# Patient Record
Sex: Female | Born: 1970 | Race: White | Hispanic: No | Marital: Married | Smoking: Never smoker
Health system: Southern US, Community
[De-identification: ages and names within clinical notes are randomized; demographics above are authoritative.]

## PROBLEM LIST (undated history)

## (undated) DIAGNOSIS — O24419 Gestational diabetes mellitus in pregnancy, unspecified control: Secondary | ICD-10-CM

## (undated) DIAGNOSIS — I10 Essential (primary) hypertension: Secondary | ICD-10-CM

## (undated) DIAGNOSIS — O1490 Unspecified pre-eclampsia, unspecified trimester: Secondary | ICD-10-CM

## (undated) DIAGNOSIS — E669 Obesity, unspecified: Secondary | ICD-10-CM

## (undated) DIAGNOSIS — F419 Anxiety disorder, unspecified: Secondary | ICD-10-CM

## (undated) HISTORY — DX: Anxiety disorder, unspecified: F41.9

## (undated) HISTORY — DX: Essential (primary) hypertension: I10

## (undated) HISTORY — DX: Gestational diabetes mellitus in pregnancy, unspecified control: O24.419

## (undated) HISTORY — PX: NASAL SEPTUM SURGERY: SHX37

## (undated) HISTORY — DX: Obesity, unspecified: E66.9

## (undated) HISTORY — DX: Unspecified pre-eclampsia, unspecified trimester: O14.90

---

## 2003-02-20 HISTORY — PX: SPINAL FUSION: SHX223

## 2003-09-02 ENCOUNTER — Encounter: Admission: RE | Admit: 2003-09-02 | Discharge: 2003-09-02 | Payer: Self-pay | Admitting: Neurosurgery

## 2003-09-23 ENCOUNTER — Inpatient Hospital Stay (HOSPITAL_COMMUNITY): Admission: RE | Admit: 2003-09-23 | Discharge: 2003-09-25 | Payer: Self-pay | Admitting: Neurosurgery

## 2003-11-09 ENCOUNTER — Encounter: Admission: RE | Admit: 2003-11-09 | Discharge: 2003-11-09 | Payer: Self-pay | Admitting: Neurosurgery

## 2003-12-21 ENCOUNTER — Encounter: Admission: RE | Admit: 2003-12-21 | Discharge: 2003-12-21 | Payer: Self-pay | Admitting: Neurosurgery

## 2006-02-19 HISTORY — PX: ENDOMETRIAL ABLATION: SHX621

## 2010-04-04 ENCOUNTER — Ambulatory Visit (INDEPENDENT_AMBULATORY_CARE_PROVIDER_SITE_OTHER): Payer: BC Managed Care – PPO | Admitting: Cardiovascular Disease

## 2010-04-04 DIAGNOSIS — R079 Chest pain, unspecified: Secondary | ICD-10-CM

## 2010-04-04 DIAGNOSIS — I10 Essential (primary) hypertension: Secondary | ICD-10-CM

## 2010-04-04 DIAGNOSIS — R011 Cardiac murmur, unspecified: Secondary | ICD-10-CM

## 2010-04-10 ENCOUNTER — Telehealth (INDEPENDENT_AMBULATORY_CARE_PROVIDER_SITE_OTHER): Payer: Self-pay | Admitting: *Deleted

## 2010-04-17 ENCOUNTER — Telehealth: Payer: Self-pay | Admitting: Cardiovascular Disease

## 2010-04-18 NOTE — Progress Notes (Signed)
----   Converted from flag ---- ---- 04/07/2010 4:36 PM, Marilynne Halsted, CMA, AAMA wrote: Victorino Dike,  Pt has BCBS of [place] only.  Per [employer], pt's plan does not require authorization.  Thanks,  Charmaine ------------------------------

## 2010-04-20 ENCOUNTER — Telehealth: Payer: Self-pay | Admitting: Cardiovascular Disease

## 2010-04-27 NOTE — Progress Notes (Signed)
Summary: stress test results  Phone Note Outgoing Call   Call placed by: Dessie Coma  LPN,  April 20, 2010 2:20 PM Call placed to: Patient Summary of Call: LMVM-informed patient stress test was normal per Dr. Kirke Corin.

## 2010-04-27 NOTE — Progress Notes (Signed)
Summary: pt would like test results/**LM/nm  Phone Note Call from Patient Call back at Home Phone [phone] Call back at [phone]   Caller: Patient Reason for Call: Talk to Nurse, Talk to Doctor, Lab or Test Results Summary of Call: pt would like results of stress test she took last week Initial call taken by: Omer Jack,  April 17, 2010 1:36 PM  Follow-up for Phone Call        [facility]. Ollen Gross, RN, BSN  April 17, 2010 1:53 PM  Patient had stress test done last week @ [facility]. The results are not in EMR. Advised patient to call the [place] office Thursday and hopefully her results would be in. Whitney Maeola Sarah RN  April 17, 2010 3:58 PM  Follow-up by: Whitney Maeola Sarah RN,  April 17, 2010 3:58 PM  Additional Follow-up for Phone Call Additional follow up Details #1::        spoke to medical records at [facility].  They are to fax results to [phone].   LMVM notifiying patient of normal stress test.  To call back if any questions. Additional Follow-up by: Dessie Coma  LPN,  April 18, 2010 10:23 AM

## 2010-05-12 NOTE — Letter (Signed)
April 04, 2010   Dr. Gwendlyn Deutscher [address] [place], [place]  [address]  RE:  Taylor, [name] MRN:  [id number]  /  DOB:  1970-05-12  Dear Dr. Jeanie Sewer,  Thank you for referring Taylor [name] for further cardiac evaluation.  As you are aware, this is a pleasant 40 year old female with Taylor following problem list: 1. Hypertension with previous history of preeclampsia. 2. History of gestational diabetes. 3. Family history of premature coronary artery disease.  HISTORY OF PRESENT ILLNESS:  Taylor [name] is here today for evaluation of chest pain, which has been going on for a few weeks.  Taylor [name] has been under significant stress lately after Taylor sudden death of her father at Taylor age of 75.  An autopsy showed that he died from a myocardial infarction and undiagnosed coronary artery disease.  Since then, Taylor [name] has been very worried.  She started having episodes of chest discomfort.  According to her, she was having those before, but did not pay much attention to it.  She describes a substernal aching sensation, which has been induced recently by stress.  She did have a few episodes while she was doing her exercises in Taylor past.  She is very active and before her father's death, she used to exercise 3-4 times a week by running about 3 miles.  She also noted that her blood pressure has been running high recently.  She was started yesterday on atenolol. She has felt better since then.  She denies any previous cardiac history.  She does not have any dyspnea.  There is no syncope or presyncope.  She does complain of palpitations, which actually have improved after starting atenolol.  MEDICATIONS: 1. Atenolol 50 mg once daily. 2. Aspirin 81 mg once daily. 3. Xanax 0.5 mg at bedtime as needed.  ALLERGIES:  NO KNOWN DRUG ALLERGIES.  SOCIAL HISTORY:  Negative for smoking, alcohol or recreational drug use. She drinks 2 cups of coffee a day.  She was exercising regularly  until Taylor death of her father.  She works part-time in an office.  PAST SURGICAL HISTORY: 1. C-section. 2. Spinal fusion.  FAMILY HISTORY:  Very significant for premature coronary artery disease. Her father died suddenly at Taylor age of 43 from a myocardial infarction. Her maternal grandfather also had heart disease at Taylor age of 53.  Her grandfather had heart disease at Taylor age of 39.  REVIEW OF SYSTEMS:  Remarkable for chest pain as outlined above.  There has been also significant anxiety.  A full review of system was performed and is otherwise negative.  PHYSICAL EXAMINATION:  GENERAL:  Taylor [name] appears to be at her stated age and in no acute distress. VITAL SIGNS:  Weight is 155.2 pounds.  Blood pressure is 139/95, pulse is 74, oxygen saturation is 97% on room air. HEENT:  Normocephalic, atraumatic. NECK:  No JVD or carotid bruits. RESPIRATORY:  Normal respiratory effort with no use of accessory muscles.  Auscultation reveals normal breath sounds. CARDIOVASCULAR:  Normal PMI.  Normal S1-S2 with no gallops.  There is a 1/6 systolic ejection murmur at Taylor aortic area, which seems to be a benign flow murmur. ABDOMEN:  Benign, nontender, nondistended. EXTREMITIES:  With no clubbing, cyanosis or edema. SKIN:  Warm and dry with no rash. PSYCHIATRIC:  She is alert and oriented x3 with normal mood and affect. MUSCULOSKELETAL:  There is normal muscle strength in Taylor upper and lower extremities.  An electrocardiogram was performed, which showed normal  sinus rhythm with incomplete right bundle branch block.  IMPRESSION: 1. Atypical chest pain:  Her symptoms seem to be induced by anxiety.     Her risk factors for coronary artery disease include hypertension     and family history of premature coronary artery disease.  Her     electrocardiogram does not show any acute changes.  Incomplete     right bundle branch block can be a normal variant, especially in     young people.  I  believe her recurrent symptoms warrants further     investigation.  Thus, I recommend proceeding with a stress     echocardiogram for further evaluation.  I discussed with her that     even if Taylor stress test is normal, continuing healthy lifestyle     modifications is strongly recommended due to her strong family     history of premature coronary artery disease.  If Taylor stress test     is normal, then she does not need to continue taking aspirin daily.     I do recommend that she get her fasting lipid profile checked and     treated if needed based on Taylor results. 2. Hypertension:  Seems to be essential hypertension due to her family     history of this.  This has improved after starting atenolol. 3. Benign murmur, seems to be a flow murmur.  She will be having a     stress echocardiogram, which will give some idea about that as     well.  Taylor [name] will be notified with Taylor results of Taylor stress     test.  She will follow up if her stress test is abnormal.  Thank you for allowing me to participate in Taylor care of your [name].   Sincerely,     Lorine Bears, MD Electronically Signed   MA/MedQ  DD: 04/04/2010  DT: 04/04/2010  Job #: [id number]

## 2010-10-26 ENCOUNTER — Encounter: Payer: Self-pay | Admitting: Cardiovascular Disease

## 2010-11-10 ENCOUNTER — Encounter: Payer: Self-pay | Admitting: Cardiovascular Disease

## 2015-09-14 DIAGNOSIS — S0501XA Injury of conjunctiva and corneal abrasion without foreign body, right eye, initial encounter: Secondary | ICD-10-CM | POA: Diagnosis not present

## 2015-10-14 DIAGNOSIS — Z1151 Encounter for screening for human papillomavirus (HPV): Secondary | ICD-10-CM | POA: Diagnosis not present

## 2015-10-14 DIAGNOSIS — R350 Frequency of micturition: Secondary | ICD-10-CM | POA: Diagnosis not present

## 2015-10-14 DIAGNOSIS — Z01419 Encounter for gynecological examination (general) (routine) without abnormal findings: Secondary | ICD-10-CM | POA: Diagnosis not present

## 2015-10-14 DIAGNOSIS — Z1239 Encounter for other screening for malignant neoplasm of breast: Secondary | ICD-10-CM | POA: Diagnosis not present

## 2015-10-28 DIAGNOSIS — Z1231 Encounter for screening mammogram for malignant neoplasm of breast: Secondary | ICD-10-CM | POA: Diagnosis not present

## 2015-11-03 DIAGNOSIS — N6489 Other specified disorders of breast: Secondary | ICD-10-CM | POA: Diagnosis not present

## 2015-11-03 DIAGNOSIS — R928 Other abnormal and inconclusive findings on diagnostic imaging of breast: Secondary | ICD-10-CM | POA: Diagnosis not present

## 2016-05-03 DIAGNOSIS — R928 Other abnormal and inconclusive findings on diagnostic imaging of breast: Secondary | ICD-10-CM | POA: Diagnosis not present

## 2016-06-21 DIAGNOSIS — E7800 Pure hypercholesterolemia, unspecified: Secondary | ICD-10-CM | POA: Diagnosis not present

## 2016-06-21 DIAGNOSIS — E042 Nontoxic multinodular goiter: Secondary | ICD-10-CM | POA: Diagnosis not present

## 2016-06-21 DIAGNOSIS — Z6828 Body mass index (BMI) 28.0-28.9, adult: Secondary | ICD-10-CM | POA: Diagnosis not present

## 2016-06-28 DIAGNOSIS — E042 Nontoxic multinodular goiter: Secondary | ICD-10-CM | POA: Diagnosis not present

## 2016-06-28 DIAGNOSIS — E041 Nontoxic single thyroid nodule: Secondary | ICD-10-CM | POA: Diagnosis not present

## 2016-10-16 DIAGNOSIS — Z01419 Encounter for gynecological examination (general) (routine) without abnormal findings: Secondary | ICD-10-CM | POA: Diagnosis not present

## 2016-11-05 DIAGNOSIS — R928 Other abnormal and inconclusive findings on diagnostic imaging of breast: Secondary | ICD-10-CM | POA: Diagnosis not present

## 2016-11-09 DIAGNOSIS — M545 Low back pain: Secondary | ICD-10-CM | POA: Diagnosis not present

## 2016-11-09 DIAGNOSIS — M9905 Segmental and somatic dysfunction of pelvic region: Secondary | ICD-10-CM | POA: Diagnosis not present

## 2016-11-09 DIAGNOSIS — M9903 Segmental and somatic dysfunction of lumbar region: Secondary | ICD-10-CM | POA: Diagnosis not present

## 2016-11-09 DIAGNOSIS — M9902 Segmental and somatic dysfunction of thoracic region: Secondary | ICD-10-CM | POA: Diagnosis not present

## 2016-11-12 DIAGNOSIS — M9905 Segmental and somatic dysfunction of pelvic region: Secondary | ICD-10-CM | POA: Diagnosis not present

## 2016-11-12 DIAGNOSIS — M9902 Segmental and somatic dysfunction of thoracic region: Secondary | ICD-10-CM | POA: Diagnosis not present

## 2016-11-12 DIAGNOSIS — M545 Low back pain: Secondary | ICD-10-CM | POA: Diagnosis not present

## 2016-11-12 DIAGNOSIS — M9903 Segmental and somatic dysfunction of lumbar region: Secondary | ICD-10-CM | POA: Diagnosis not present

## 2016-11-13 DIAGNOSIS — J329 Chronic sinusitis, unspecified: Secondary | ICD-10-CM | POA: Diagnosis not present

## 2016-11-13 DIAGNOSIS — J302 Other seasonal allergic rhinitis: Secondary | ICD-10-CM | POA: Diagnosis not present

## 2016-11-13 DIAGNOSIS — E876 Hypokalemia: Secondary | ICD-10-CM | POA: Diagnosis not present

## 2016-11-13 DIAGNOSIS — Z6828 Body mass index (BMI) 28.0-28.9, adult: Secondary | ICD-10-CM | POA: Diagnosis not present

## 2016-11-26 DIAGNOSIS — Z6829 Body mass index (BMI) 29.0-29.9, adult: Secondary | ICD-10-CM | POA: Diagnosis not present

## 2016-11-26 DIAGNOSIS — I10 Essential (primary) hypertension: Secondary | ICD-10-CM | POA: Diagnosis not present

## 2016-11-26 DIAGNOSIS — J329 Chronic sinusitis, unspecified: Secondary | ICD-10-CM | POA: Diagnosis not present

## 2016-11-26 DIAGNOSIS — J302 Other seasonal allergic rhinitis: Secondary | ICD-10-CM | POA: Diagnosis not present

## 2016-11-26 DIAGNOSIS — E876 Hypokalemia: Secondary | ICD-10-CM | POA: Diagnosis not present

## 2016-12-10 DIAGNOSIS — J342 Deviated nasal septum: Secondary | ICD-10-CM | POA: Diagnosis not present

## 2016-12-10 DIAGNOSIS — J3489 Other specified disorders of nose and nasal sinuses: Secondary | ICD-10-CM | POA: Diagnosis not present

## 2016-12-10 DIAGNOSIS — Z8709 Personal history of other diseases of the respiratory system: Secondary | ICD-10-CM | POA: Diagnosis not present

## 2016-12-10 DIAGNOSIS — R0981 Nasal congestion: Secondary | ICD-10-CM | POA: Diagnosis not present

## 2016-12-19 DIAGNOSIS — J329 Chronic sinusitis, unspecified: Secondary | ICD-10-CM | POA: Diagnosis not present

## 2016-12-24 DIAGNOSIS — J342 Deviated nasal septum: Secondary | ICD-10-CM | POA: Diagnosis not present

## 2016-12-24 DIAGNOSIS — J343 Hypertrophy of nasal turbinates: Secondary | ICD-10-CM | POA: Diagnosis not present

## 2016-12-24 DIAGNOSIS — J3489 Other specified disorders of nose and nasal sinuses: Secondary | ICD-10-CM | POA: Diagnosis not present

## 2016-12-24 DIAGNOSIS — R0981 Nasal congestion: Secondary | ICD-10-CM | POA: Diagnosis not present

## 2016-12-28 DIAGNOSIS — J329 Chronic sinusitis, unspecified: Secondary | ICD-10-CM | POA: Diagnosis not present

## 2016-12-28 DIAGNOSIS — Z6828 Body mass index (BMI) 28.0-28.9, adult: Secondary | ICD-10-CM | POA: Diagnosis not present

## 2016-12-28 DIAGNOSIS — J342 Deviated nasal septum: Secondary | ICD-10-CM | POA: Diagnosis not present

## 2016-12-28 DIAGNOSIS — J343 Hypertrophy of nasal turbinates: Secondary | ICD-10-CM | POA: Diagnosis not present

## 2017-02-04 DIAGNOSIS — J343 Hypertrophy of nasal turbinates: Secondary | ICD-10-CM | POA: Diagnosis not present

## 2017-02-04 DIAGNOSIS — J3489 Other specified disorders of nose and nasal sinuses: Secondary | ICD-10-CM | POA: Diagnosis not present

## 2017-02-04 DIAGNOSIS — J342 Deviated nasal septum: Secondary | ICD-10-CM | POA: Diagnosis not present

## 2017-02-04 DIAGNOSIS — R0981 Nasal congestion: Secondary | ICD-10-CM | POA: Diagnosis not present

## 2017-02-13 DIAGNOSIS — R0981 Nasal congestion: Secondary | ICD-10-CM | POA: Diagnosis not present

## 2017-02-13 DIAGNOSIS — J3489 Other specified disorders of nose and nasal sinuses: Secondary | ICD-10-CM | POA: Diagnosis not present

## 2017-02-13 DIAGNOSIS — J324 Chronic pansinusitis: Secondary | ICD-10-CM | POA: Diagnosis not present

## 2017-02-13 DIAGNOSIS — J0111 Acute recurrent frontal sinusitis: Secondary | ICD-10-CM | POA: Diagnosis not present

## 2017-02-13 DIAGNOSIS — J321 Chronic frontal sinusitis: Secondary | ICD-10-CM | POA: Diagnosis not present

## 2017-02-13 DIAGNOSIS — J342 Deviated nasal septum: Secondary | ICD-10-CM | POA: Diagnosis not present

## 2017-02-13 DIAGNOSIS — J0101 Acute recurrent maxillary sinusitis: Secondary | ICD-10-CM | POA: Diagnosis not present

## 2017-02-13 DIAGNOSIS — J320 Chronic maxillary sinusitis: Secondary | ICD-10-CM | POA: Diagnosis not present

## 2017-02-13 DIAGNOSIS — I10 Essential (primary) hypertension: Secondary | ICD-10-CM | POA: Diagnosis not present

## 2017-02-13 DIAGNOSIS — Z79899 Other long term (current) drug therapy: Secondary | ICD-10-CM | POA: Diagnosis not present

## 2017-02-13 DIAGNOSIS — J0121 Acute recurrent ethmoidal sinusitis: Secondary | ICD-10-CM | POA: Diagnosis not present

## 2017-02-13 DIAGNOSIS — J322 Chronic ethmoidal sinusitis: Secondary | ICD-10-CM | POA: Diagnosis not present

## 2017-02-13 DIAGNOSIS — J329 Chronic sinusitis, unspecified: Secondary | ICD-10-CM | POA: Diagnosis not present

## 2017-02-13 DIAGNOSIS — J343 Hypertrophy of nasal turbinates: Secondary | ICD-10-CM | POA: Diagnosis not present

## 2017-02-21 DIAGNOSIS — J329 Chronic sinusitis, unspecified: Secondary | ICD-10-CM | POA: Diagnosis not present

## 2017-02-28 DIAGNOSIS — J329 Chronic sinusitis, unspecified: Secondary | ICD-10-CM | POA: Diagnosis not present

## 2017-03-11 DIAGNOSIS — J329 Chronic sinusitis, unspecified: Secondary | ICD-10-CM | POA: Diagnosis not present

## 2017-04-09 DIAGNOSIS — F419 Anxiety disorder, unspecified: Secondary | ICD-10-CM | POA: Diagnosis not present

## 2017-06-24 DIAGNOSIS — J3489 Other specified disorders of nose and nasal sinuses: Secondary | ICD-10-CM | POA: Diagnosis not present

## 2017-06-24 DIAGNOSIS — Z9889 Other specified postprocedural states: Secondary | ICD-10-CM | POA: Diagnosis not present

## 2017-06-24 DIAGNOSIS — Z8709 Personal history of other diseases of the respiratory system: Secondary | ICD-10-CM | POA: Diagnosis not present

## 2017-10-01 DIAGNOSIS — Z6830 Body mass index (BMI) 30.0-30.9, adult: Secondary | ICD-10-CM | POA: Diagnosis not present

## 2017-10-01 DIAGNOSIS — E785 Hyperlipidemia, unspecified: Secondary | ICD-10-CM | POA: Diagnosis not present

## 2017-10-01 DIAGNOSIS — I10 Essential (primary) hypertension: Secondary | ICD-10-CM | POA: Diagnosis not present

## 2017-10-01 DIAGNOSIS — Z1339 Encounter for screening examination for other mental health and behavioral disorders: Secondary | ICD-10-CM | POA: Diagnosis not present

## 2017-10-01 DIAGNOSIS — R5383 Other fatigue: Secondary | ICD-10-CM | POA: Diagnosis not present

## 2017-11-06 DIAGNOSIS — Z1239 Encounter for other screening for malignant neoplasm of breast: Secondary | ICD-10-CM | POA: Diagnosis not present

## 2017-11-06 DIAGNOSIS — Z9189 Other specified personal risk factors, not elsewhere classified: Secondary | ICD-10-CM | POA: Diagnosis not present

## 2017-11-06 DIAGNOSIS — Z01419 Encounter for gynecological examination (general) (routine) without abnormal findings: Secondary | ICD-10-CM | POA: Diagnosis not present

## 2017-11-06 DIAGNOSIS — Z23 Encounter for immunization: Secondary | ICD-10-CM | POA: Diagnosis not present

## 2017-11-07 DIAGNOSIS — M9905 Segmental and somatic dysfunction of pelvic region: Secondary | ICD-10-CM | POA: Diagnosis not present

## 2017-11-07 DIAGNOSIS — M545 Low back pain: Secondary | ICD-10-CM | POA: Diagnosis not present

## 2017-11-07 DIAGNOSIS — M9903 Segmental and somatic dysfunction of lumbar region: Secondary | ICD-10-CM | POA: Diagnosis not present

## 2017-11-07 DIAGNOSIS — M9902 Segmental and somatic dysfunction of thoracic region: Secondary | ICD-10-CM | POA: Diagnosis not present

## 2017-11-12 DIAGNOSIS — M9902 Segmental and somatic dysfunction of thoracic region: Secondary | ICD-10-CM | POA: Diagnosis not present

## 2017-11-12 DIAGNOSIS — M545 Low back pain: Secondary | ICD-10-CM | POA: Diagnosis not present

## 2017-11-12 DIAGNOSIS — M9903 Segmental and somatic dysfunction of lumbar region: Secondary | ICD-10-CM | POA: Diagnosis not present

## 2017-11-12 DIAGNOSIS — M9905 Segmental and somatic dysfunction of pelvic region: Secondary | ICD-10-CM | POA: Diagnosis not present

## 2017-11-14 DIAGNOSIS — M545 Low back pain: Secondary | ICD-10-CM | POA: Diagnosis not present

## 2017-11-14 DIAGNOSIS — M9902 Segmental and somatic dysfunction of thoracic region: Secondary | ICD-10-CM | POA: Diagnosis not present

## 2017-11-14 DIAGNOSIS — M9905 Segmental and somatic dysfunction of pelvic region: Secondary | ICD-10-CM | POA: Diagnosis not present

## 2017-11-14 DIAGNOSIS — M9903 Segmental and somatic dysfunction of lumbar region: Secondary | ICD-10-CM | POA: Diagnosis not present

## 2017-11-20 DIAGNOSIS — G542 Cervical root disorders, not elsewhere classified: Secondary | ICD-10-CM | POA: Diagnosis not present

## 2017-11-20 DIAGNOSIS — Z6831 Body mass index (BMI) 31.0-31.9, adult: Secondary | ICD-10-CM | POA: Diagnosis not present

## 2017-11-22 DIAGNOSIS — G542 Cervical root disorders, not elsewhere classified: Secondary | ICD-10-CM | POA: Diagnosis not present

## 2017-11-22 DIAGNOSIS — M6281 Muscle weakness (generalized): Secondary | ICD-10-CM | POA: Diagnosis not present

## 2017-11-22 DIAGNOSIS — M542 Cervicalgia: Secondary | ICD-10-CM | POA: Diagnosis not present

## 2017-11-22 DIAGNOSIS — M2560 Stiffness of unspecified joint, not elsewhere classified: Secondary | ICD-10-CM | POA: Diagnosis not present

## 2017-11-22 DIAGNOSIS — R293 Abnormal posture: Secondary | ICD-10-CM | POA: Diagnosis not present

## 2017-11-25 DIAGNOSIS — M2560 Stiffness of unspecified joint, not elsewhere classified: Secondary | ICD-10-CM | POA: Diagnosis not present

## 2017-11-25 DIAGNOSIS — R293 Abnormal posture: Secondary | ICD-10-CM | POA: Diagnosis not present

## 2017-11-25 DIAGNOSIS — G542 Cervical root disorders, not elsewhere classified: Secondary | ICD-10-CM | POA: Diagnosis not present

## 2017-11-25 DIAGNOSIS — M542 Cervicalgia: Secondary | ICD-10-CM | POA: Diagnosis not present

## 2017-11-28 DIAGNOSIS — M2560 Stiffness of unspecified joint, not elsewhere classified: Secondary | ICD-10-CM | POA: Diagnosis not present

## 2017-11-28 DIAGNOSIS — G542 Cervical root disorders, not elsewhere classified: Secondary | ICD-10-CM | POA: Diagnosis not present

## 2017-11-28 DIAGNOSIS — M542 Cervicalgia: Secondary | ICD-10-CM | POA: Diagnosis not present

## 2017-11-28 DIAGNOSIS — R293 Abnormal posture: Secondary | ICD-10-CM | POA: Diagnosis not present

## 2017-12-02 DIAGNOSIS — G542 Cervical root disorders, not elsewhere classified: Secondary | ICD-10-CM | POA: Diagnosis not present

## 2017-12-02 DIAGNOSIS — M542 Cervicalgia: Secondary | ICD-10-CM | POA: Diagnosis not present

## 2017-12-02 DIAGNOSIS — R293 Abnormal posture: Secondary | ICD-10-CM | POA: Diagnosis not present

## 2017-12-02 DIAGNOSIS — M6281 Muscle weakness (generalized): Secondary | ICD-10-CM | POA: Diagnosis not present

## 2017-12-02 DIAGNOSIS — M2560 Stiffness of unspecified joint, not elsewhere classified: Secondary | ICD-10-CM | POA: Diagnosis not present

## 2017-12-04 DIAGNOSIS — G542 Cervical root disorders, not elsewhere classified: Secondary | ICD-10-CM | POA: Diagnosis not present

## 2017-12-04 DIAGNOSIS — Z6831 Body mass index (BMI) 31.0-31.9, adult: Secondary | ICD-10-CM | POA: Diagnosis not present

## 2017-12-05 DIAGNOSIS — M6281 Muscle weakness (generalized): Secondary | ICD-10-CM | POA: Diagnosis not present

## 2017-12-05 DIAGNOSIS — G542 Cervical root disorders, not elsewhere classified: Secondary | ICD-10-CM | POA: Diagnosis not present

## 2017-12-05 DIAGNOSIS — M2560 Stiffness of unspecified joint, not elsewhere classified: Secondary | ICD-10-CM | POA: Diagnosis not present

## 2017-12-05 DIAGNOSIS — M542 Cervicalgia: Secondary | ICD-10-CM | POA: Diagnosis not present

## 2017-12-05 DIAGNOSIS — R293 Abnormal posture: Secondary | ICD-10-CM | POA: Diagnosis not present

## 2017-12-06 DIAGNOSIS — M50222 Other cervical disc displacement at C5-C6 level: Secondary | ICD-10-CM | POA: Diagnosis not present

## 2017-12-06 DIAGNOSIS — M542 Cervicalgia: Secondary | ICD-10-CM | POA: Diagnosis not present

## 2017-12-10 DIAGNOSIS — M5412 Radiculopathy, cervical region: Secondary | ICD-10-CM | POA: Diagnosis not present

## 2017-12-10 DIAGNOSIS — I10 Essential (primary) hypertension: Secondary | ICD-10-CM | POA: Diagnosis not present

## 2017-12-10 DIAGNOSIS — M5020 Other cervical disc displacement, unspecified cervical region: Secondary | ICD-10-CM | POA: Diagnosis not present

## 2017-12-10 DIAGNOSIS — M4712 Other spondylosis with myelopathy, cervical region: Secondary | ICD-10-CM | POA: Diagnosis not present

## 2017-12-25 DIAGNOSIS — M4712 Other spondylosis with myelopathy, cervical region: Secondary | ICD-10-CM | POA: Diagnosis not present

## 2017-12-25 DIAGNOSIS — M4722 Other spondylosis with radiculopathy, cervical region: Secondary | ICD-10-CM | POA: Diagnosis not present

## 2017-12-25 DIAGNOSIS — M50122 Cervical disc disorder at C5-C6 level with radiculopathy: Secondary | ICD-10-CM | POA: Diagnosis not present

## 2018-01-14 DIAGNOSIS — M4712 Other spondylosis with myelopathy, cervical region: Secondary | ICD-10-CM | POA: Diagnosis not present

## 2018-01-21 DIAGNOSIS — Z1231 Encounter for screening mammogram for malignant neoplasm of breast: Secondary | ICD-10-CM | POA: Diagnosis not present

## 2018-07-08 DIAGNOSIS — F419 Anxiety disorder, unspecified: Secondary | ICD-10-CM | POA: Diagnosis not present

## 2018-07-08 DIAGNOSIS — Z6828 Body mass index (BMI) 28.0-28.9, adult: Secondary | ICD-10-CM | POA: Diagnosis not present

## 2018-07-08 DIAGNOSIS — J302 Other seasonal allergic rhinitis: Secondary | ICD-10-CM | POA: Diagnosis not present

## 2018-08-08 DIAGNOSIS — M5020 Other cervical disc displacement, unspecified cervical region: Secondary | ICD-10-CM | POA: Diagnosis not present

## 2018-08-08 DIAGNOSIS — Z6829 Body mass index (BMI) 29.0-29.9, adult: Secondary | ICD-10-CM | POA: Diagnosis not present

## 2018-08-08 DIAGNOSIS — I10 Essential (primary) hypertension: Secondary | ICD-10-CM | POA: Diagnosis not present

## 2018-08-08 DIAGNOSIS — M4712 Other spondylosis with myelopathy, cervical region: Secondary | ICD-10-CM | POA: Diagnosis not present

## 2018-11-13 DIAGNOSIS — B9689 Other specified bacterial agents as the cause of diseases classified elsewhere: Secondary | ICD-10-CM | POA: Diagnosis not present

## 2018-11-13 DIAGNOSIS — Z6830 Body mass index (BMI) 30.0-30.9, adult: Secondary | ICD-10-CM | POA: Diagnosis not present

## 2018-11-13 DIAGNOSIS — J329 Chronic sinusitis, unspecified: Secondary | ICD-10-CM | POA: Diagnosis not present

## 2018-11-19 DIAGNOSIS — Z1239 Encounter for other screening for malignant neoplasm of breast: Secondary | ICD-10-CM | POA: Diagnosis not present

## 2018-11-19 DIAGNOSIS — Z01419 Encounter for gynecological examination (general) (routine) without abnormal findings: Secondary | ICD-10-CM | POA: Diagnosis not present

## 2018-11-19 DIAGNOSIS — Z23 Encounter for immunization: Secondary | ICD-10-CM | POA: Diagnosis not present

## 2018-12-18 DIAGNOSIS — Z20828 Contact with and (suspected) exposure to other viral communicable diseases: Secondary | ICD-10-CM | POA: Diagnosis not present

## 2018-12-18 DIAGNOSIS — J329 Chronic sinusitis, unspecified: Secondary | ICD-10-CM | POA: Diagnosis not present

## 2018-12-18 DIAGNOSIS — J40 Bronchitis, not specified as acute or chronic: Secondary | ICD-10-CM | POA: Diagnosis not present

## 2019-01-06 DIAGNOSIS — J310 Chronic rhinitis: Secondary | ICD-10-CM | POA: Diagnosis not present

## 2019-01-06 DIAGNOSIS — Z6830 Body mass index (BMI) 30.0-30.9, adult: Secondary | ICD-10-CM | POA: Diagnosis not present

## 2019-01-06 DIAGNOSIS — T485X5A Adverse effect of other anti-common-cold drugs, initial encounter: Secondary | ICD-10-CM | POA: Diagnosis not present

## 2019-01-06 DIAGNOSIS — J329 Chronic sinusitis, unspecified: Secondary | ICD-10-CM | POA: Diagnosis not present

## 2019-01-23 DIAGNOSIS — Z1231 Encounter for screening mammogram for malignant neoplasm of breast: Secondary | ICD-10-CM | POA: Diagnosis not present

## 2019-01-27 DIAGNOSIS — J338 Other polyp of sinus: Secondary | ICD-10-CM | POA: Diagnosis not present

## 2019-01-27 DIAGNOSIS — J329 Chronic sinusitis, unspecified: Secondary | ICD-10-CM | POA: Diagnosis not present

## 2019-02-02 DIAGNOSIS — Z01818 Encounter for other preprocedural examination: Secondary | ICD-10-CM | POA: Diagnosis not present

## 2019-02-06 DIAGNOSIS — J322 Chronic ethmoidal sinusitis: Secondary | ICD-10-CM | POA: Diagnosis not present

## 2019-02-06 DIAGNOSIS — J324 Chronic pansinusitis: Secondary | ICD-10-CM | POA: Diagnosis not present

## 2019-02-06 DIAGNOSIS — J338 Other polyp of sinus: Secondary | ICD-10-CM | POA: Diagnosis not present

## 2019-02-06 DIAGNOSIS — J321 Chronic frontal sinusitis: Secondary | ICD-10-CM | POA: Diagnosis not present

## 2019-02-06 DIAGNOSIS — J019 Acute sinusitis, unspecified: Secondary | ICD-10-CM | POA: Diagnosis not present

## 2019-02-06 DIAGNOSIS — J330 Polyp of nasal cavity: Secondary | ICD-10-CM | POA: Diagnosis not present

## 2019-02-06 DIAGNOSIS — J320 Chronic maxillary sinusitis: Secondary | ICD-10-CM | POA: Diagnosis not present

## 2019-02-06 DIAGNOSIS — J323 Chronic sphenoidal sinusitis: Secondary | ICD-10-CM | POA: Diagnosis not present

## 2019-02-17 DIAGNOSIS — J3489 Other specified disorders of nose and nasal sinuses: Secondary | ICD-10-CM | POA: Diagnosis not present

## 2019-02-24 DIAGNOSIS — J3489 Other specified disorders of nose and nasal sinuses: Secondary | ICD-10-CM | POA: Diagnosis not present

## 2019-03-14 DIAGNOSIS — Z20828 Contact with and (suspected) exposure to other viral communicable diseases: Secondary | ICD-10-CM | POA: Diagnosis not present

## 2019-03-27 DIAGNOSIS — J3489 Other specified disorders of nose and nasal sinuses: Secondary | ICD-10-CM | POA: Diagnosis not present

## 2019-04-25 DIAGNOSIS — A78 Q fever: Secondary | ICD-10-CM | POA: Diagnosis not present

## 2019-04-25 DIAGNOSIS — R05 Cough: Secondary | ICD-10-CM | POA: Diagnosis not present

## 2019-04-25 DIAGNOSIS — Z20828 Contact with and (suspected) exposure to other viral communicable diseases: Secondary | ICD-10-CM | POA: Diagnosis not present

## 2019-04-25 DIAGNOSIS — R509 Fever, unspecified: Secondary | ICD-10-CM | POA: Diagnosis not present

## 2019-05-06 DIAGNOSIS — J324 Chronic pansinusitis: Secondary | ICD-10-CM | POA: Diagnosis not present

## 2019-05-08 DIAGNOSIS — M9905 Segmental and somatic dysfunction of pelvic region: Secondary | ICD-10-CM | POA: Diagnosis not present

## 2019-05-08 DIAGNOSIS — M545 Low back pain: Secondary | ICD-10-CM | POA: Diagnosis not present

## 2019-05-08 DIAGNOSIS — M9902 Segmental and somatic dysfunction of thoracic region: Secondary | ICD-10-CM | POA: Diagnosis not present

## 2019-05-08 DIAGNOSIS — M9903 Segmental and somatic dysfunction of lumbar region: Secondary | ICD-10-CM | POA: Diagnosis not present

## 2019-09-14 DIAGNOSIS — M545 Low back pain: Secondary | ICD-10-CM | POA: Diagnosis not present

## 2019-09-14 DIAGNOSIS — M9905 Segmental and somatic dysfunction of pelvic region: Secondary | ICD-10-CM | POA: Diagnosis not present

## 2019-09-14 DIAGNOSIS — M9902 Segmental and somatic dysfunction of thoracic region: Secondary | ICD-10-CM | POA: Diagnosis not present

## 2019-09-14 DIAGNOSIS — M9903 Segmental and somatic dysfunction of lumbar region: Secondary | ICD-10-CM | POA: Diagnosis not present

## 2019-09-15 DIAGNOSIS — F4323 Adjustment disorder with mixed anxiety and depressed mood: Secondary | ICD-10-CM | POA: Diagnosis not present

## 2019-09-29 DIAGNOSIS — F4323 Adjustment disorder with mixed anxiety and depressed mood: Secondary | ICD-10-CM | POA: Diagnosis not present

## 2019-10-13 DIAGNOSIS — F4323 Adjustment disorder with mixed anxiety and depressed mood: Secondary | ICD-10-CM | POA: Diagnosis not present

## 2019-11-02 DIAGNOSIS — F4323 Adjustment disorder with mixed anxiety and depressed mood: Secondary | ICD-10-CM | POA: Diagnosis not present

## 2019-11-13 DIAGNOSIS — J329 Chronic sinusitis, unspecified: Secondary | ICD-10-CM | POA: Diagnosis not present

## 2019-11-13 DIAGNOSIS — J40 Bronchitis, not specified as acute or chronic: Secondary | ICD-10-CM | POA: Diagnosis not present

## 2019-11-23 DIAGNOSIS — F4323 Adjustment disorder with mixed anxiety and depressed mood: Secondary | ICD-10-CM | POA: Diagnosis not present

## 2019-12-07 DIAGNOSIS — Z01419 Encounter for gynecological examination (general) (routine) without abnormal findings: Secondary | ICD-10-CM | POA: Diagnosis not present

## 2019-12-07 DIAGNOSIS — Z23 Encounter for immunization: Secondary | ICD-10-CM | POA: Diagnosis not present

## 2019-12-08 DIAGNOSIS — L659 Nonscarring hair loss, unspecified: Secondary | ICD-10-CM | POA: Diagnosis not present

## 2019-12-08 DIAGNOSIS — Z8249 Family history of ischemic heart disease and other diseases of the circulatory system: Secondary | ICD-10-CM | POA: Diagnosis not present

## 2019-12-08 DIAGNOSIS — R5383 Other fatigue: Secondary | ICD-10-CM | POA: Diagnosis not present

## 2019-12-08 DIAGNOSIS — R454 Irritability and anger: Secondary | ICD-10-CM | POA: Diagnosis not present

## 2019-12-08 DIAGNOSIS — Z131 Encounter for screening for diabetes mellitus: Secondary | ICD-10-CM | POA: Diagnosis not present

## 2019-12-08 DIAGNOSIS — E663 Overweight: Secondary | ICD-10-CM | POA: Diagnosis not present

## 2019-12-08 DIAGNOSIS — Z6831 Body mass index (BMI) 31.0-31.9, adult: Secondary | ICD-10-CM | POA: Diagnosis not present

## 2019-12-08 DIAGNOSIS — Z833 Family history of diabetes mellitus: Secondary | ICD-10-CM | POA: Diagnosis not present

## 2019-12-08 DIAGNOSIS — Z13220 Encounter for screening for lipoid disorders: Secondary | ICD-10-CM | POA: Diagnosis not present

## 2019-12-08 DIAGNOSIS — Z01419 Encounter for gynecological examination (general) (routine) without abnormal findings: Secondary | ICD-10-CM | POA: Diagnosis not present

## 2019-12-08 DIAGNOSIS — Z01411 Encounter for gynecological examination (general) (routine) with abnormal findings: Secondary | ICD-10-CM | POA: Diagnosis not present

## 2019-12-14 DIAGNOSIS — F4323 Adjustment disorder with mixed anxiety and depressed mood: Secondary | ICD-10-CM | POA: Diagnosis not present

## 2019-12-18 DIAGNOSIS — J329 Chronic sinusitis, unspecified: Secondary | ICD-10-CM | POA: Diagnosis not present

## 2020-01-01 DIAGNOSIS — Z6831 Body mass index (BMI) 31.0-31.9, adult: Secondary | ICD-10-CM | POA: Diagnosis not present

## 2020-01-01 DIAGNOSIS — R1032 Left lower quadrant pain: Secondary | ICD-10-CM | POA: Diagnosis not present

## 2020-01-04 DIAGNOSIS — F4323 Adjustment disorder with mixed anxiety and depressed mood: Secondary | ICD-10-CM | POA: Diagnosis not present

## 2020-01-07 DIAGNOSIS — R1032 Left lower quadrant pain: Secondary | ICD-10-CM | POA: Diagnosis not present

## 2020-01-07 DIAGNOSIS — N83292 Other ovarian cyst, left side: Secondary | ICD-10-CM | POA: Diagnosis not present

## 2020-01-20 DIAGNOSIS — Z03818 Encounter for observation for suspected exposure to other biological agents ruled out: Secondary | ICD-10-CM | POA: Diagnosis not present

## 2020-01-20 DIAGNOSIS — Z20822 Contact with and (suspected) exposure to covid-19: Secondary | ICD-10-CM | POA: Diagnosis not present

## 2020-01-27 DIAGNOSIS — Z1231 Encounter for screening mammogram for malignant neoplasm of breast: Secondary | ICD-10-CM | POA: Diagnosis not present

## 2020-01-28 DIAGNOSIS — Z23 Encounter for immunization: Secondary | ICD-10-CM | POA: Diagnosis not present

## 2020-02-01 DIAGNOSIS — F4323 Adjustment disorder with mixed anxiety and depressed mood: Secondary | ICD-10-CM | POA: Diagnosis not present

## 2020-02-03 DIAGNOSIS — J0111 Acute recurrent frontal sinusitis: Secondary | ICD-10-CM | POA: Diagnosis not present

## 2020-02-08 DIAGNOSIS — J324 Chronic pansinusitis: Secondary | ICD-10-CM | POA: Diagnosis not present

## 2020-02-17 DIAGNOSIS — U071 COVID-19: Secondary | ICD-10-CM | POA: Diagnosis not present

## 2020-02-17 DIAGNOSIS — Z03818 Encounter for observation for suspected exposure to other biological agents ruled out: Secondary | ICD-10-CM | POA: Diagnosis not present

## 2020-02-29 DIAGNOSIS — F4323 Adjustment disorder with mixed anxiety and depressed mood: Secondary | ICD-10-CM | POA: Diagnosis not present

## 2020-03-29 DIAGNOSIS — M4712 Other spondylosis with myelopathy, cervical region: Secondary | ICD-10-CM | POA: Diagnosis not present

## 2020-03-29 DIAGNOSIS — Z981 Arthrodesis status: Secondary | ICD-10-CM | POA: Diagnosis not present

## 2020-03-29 DIAGNOSIS — R29898 Other symptoms and signs involving the musculoskeletal system: Secondary | ICD-10-CM | POA: Diagnosis not present

## 2020-03-29 DIAGNOSIS — M5412 Radiculopathy, cervical region: Secondary | ICD-10-CM | POA: Diagnosis not present

## 2020-04-06 DIAGNOSIS — M5412 Radiculopathy, cervical region: Secondary | ICD-10-CM | POA: Diagnosis not present

## 2020-04-06 DIAGNOSIS — M6281 Muscle weakness (generalized): Secondary | ICD-10-CM | POA: Diagnosis not present

## 2020-04-06 DIAGNOSIS — M2560 Stiffness of unspecified joint, not elsewhere classified: Secondary | ICD-10-CM | POA: Diagnosis not present

## 2020-04-06 DIAGNOSIS — M542 Cervicalgia: Secondary | ICD-10-CM | POA: Diagnosis not present

## 2020-04-11 DIAGNOSIS — M2560 Stiffness of unspecified joint, not elsewhere classified: Secondary | ICD-10-CM | POA: Diagnosis not present

## 2020-04-11 DIAGNOSIS — M5412 Radiculopathy, cervical region: Secondary | ICD-10-CM | POA: Diagnosis not present

## 2020-04-11 DIAGNOSIS — R29898 Other symptoms and signs involving the musculoskeletal system: Secondary | ICD-10-CM | POA: Diagnosis not present

## 2020-04-11 DIAGNOSIS — M6281 Muscle weakness (generalized): Secondary | ICD-10-CM | POA: Diagnosis not present

## 2020-04-11 DIAGNOSIS — M542 Cervicalgia: Secondary | ICD-10-CM | POA: Diagnosis not present

## 2020-04-14 DIAGNOSIS — M5412 Radiculopathy, cervical region: Secondary | ICD-10-CM | POA: Diagnosis not present

## 2020-04-14 DIAGNOSIS — M6281 Muscle weakness (generalized): Secondary | ICD-10-CM | POA: Diagnosis not present

## 2020-04-14 DIAGNOSIS — M2560 Stiffness of unspecified joint, not elsewhere classified: Secondary | ICD-10-CM | POA: Diagnosis not present

## 2020-04-14 DIAGNOSIS — M542 Cervicalgia: Secondary | ICD-10-CM | POA: Diagnosis not present

## 2020-04-18 DIAGNOSIS — M542 Cervicalgia: Secondary | ICD-10-CM | POA: Diagnosis not present

## 2020-04-18 DIAGNOSIS — M5412 Radiculopathy, cervical region: Secondary | ICD-10-CM | POA: Diagnosis not present

## 2020-04-18 DIAGNOSIS — M2560 Stiffness of unspecified joint, not elsewhere classified: Secondary | ICD-10-CM | POA: Diagnosis not present

## 2020-04-18 DIAGNOSIS — M6281 Muscle weakness (generalized): Secondary | ICD-10-CM | POA: Diagnosis not present

## 2020-04-19 DIAGNOSIS — Z20822 Contact with and (suspected) exposure to covid-19: Secondary | ICD-10-CM | POA: Diagnosis not present

## 2020-04-19 DIAGNOSIS — Z20828 Contact with and (suspected) exposure to other viral communicable diseases: Secondary | ICD-10-CM | POA: Diagnosis not present

## 2020-04-20 DIAGNOSIS — M6281 Muscle weakness (generalized): Secondary | ICD-10-CM | POA: Diagnosis not present

## 2020-04-20 DIAGNOSIS — M542 Cervicalgia: Secondary | ICD-10-CM | POA: Diagnosis not present

## 2020-04-20 DIAGNOSIS — M2560 Stiffness of unspecified joint, not elsewhere classified: Secondary | ICD-10-CM | POA: Diagnosis not present

## 2020-04-20 DIAGNOSIS — M5412 Radiculopathy, cervical region: Secondary | ICD-10-CM | POA: Diagnosis not present

## 2020-04-27 DIAGNOSIS — Z6832 Body mass index (BMI) 32.0-32.9, adult: Secondary | ICD-10-CM | POA: Diagnosis not present

## 2020-04-27 DIAGNOSIS — F4323 Adjustment disorder with mixed anxiety and depressed mood: Secondary | ICD-10-CM | POA: Diagnosis not present

## 2020-04-27 DIAGNOSIS — J0191 Acute recurrent sinusitis, unspecified: Secondary | ICD-10-CM | POA: Diagnosis not present

## 2020-04-28 DIAGNOSIS — M5412 Radiculopathy, cervical region: Secondary | ICD-10-CM | POA: Diagnosis not present

## 2020-04-28 DIAGNOSIS — M542 Cervicalgia: Secondary | ICD-10-CM | POA: Diagnosis not present

## 2020-04-28 DIAGNOSIS — M2560 Stiffness of unspecified joint, not elsewhere classified: Secondary | ICD-10-CM | POA: Diagnosis not present

## 2020-04-28 DIAGNOSIS — M6281 Muscle weakness (generalized): Secondary | ICD-10-CM | POA: Diagnosis not present

## 2020-05-04 DIAGNOSIS — M6281 Muscle weakness (generalized): Secondary | ICD-10-CM | POA: Diagnosis not present

## 2020-05-04 DIAGNOSIS — M2560 Stiffness of unspecified joint, not elsewhere classified: Secondary | ICD-10-CM | POA: Diagnosis not present

## 2020-05-04 DIAGNOSIS — M542 Cervicalgia: Secondary | ICD-10-CM | POA: Diagnosis not present

## 2020-05-04 DIAGNOSIS — M5412 Radiculopathy, cervical region: Secondary | ICD-10-CM | POA: Diagnosis not present

## 2020-05-16 DIAGNOSIS — M5412 Radiculopathy, cervical region: Secondary | ICD-10-CM | POA: Diagnosis not present

## 2020-05-16 DIAGNOSIS — M2560 Stiffness of unspecified joint, not elsewhere classified: Secondary | ICD-10-CM | POA: Diagnosis not present

## 2020-05-16 DIAGNOSIS — M542 Cervicalgia: Secondary | ICD-10-CM | POA: Diagnosis not present

## 2020-05-16 DIAGNOSIS — M6281 Muscle weakness (generalized): Secondary | ICD-10-CM | POA: Diagnosis not present

## 2020-06-02 DIAGNOSIS — Z03818 Encounter for observation for suspected exposure to other biological agents ruled out: Secondary | ICD-10-CM | POA: Diagnosis not present

## 2020-06-02 DIAGNOSIS — Z20822 Contact with and (suspected) exposure to covid-19: Secondary | ICD-10-CM | POA: Diagnosis not present

## 2020-06-13 DIAGNOSIS — I10 Essential (primary) hypertension: Secondary | ICD-10-CM | POA: Diagnosis not present

## 2020-06-13 DIAGNOSIS — F419 Anxiety disorder, unspecified: Secondary | ICD-10-CM | POA: Diagnosis not present

## 2020-06-13 DIAGNOSIS — E7800 Pure hypercholesterolemia, unspecified: Secondary | ICD-10-CM | POA: Diagnosis not present

## 2020-06-13 DIAGNOSIS — Z6831 Body mass index (BMI) 31.0-31.9, adult: Secondary | ICD-10-CM | POA: Diagnosis not present

## 2020-07-12 DIAGNOSIS — Z6832 Body mass index (BMI) 32.0-32.9, adult: Secondary | ICD-10-CM | POA: Diagnosis not present

## 2020-07-12 DIAGNOSIS — I10 Essential (primary) hypertension: Secondary | ICD-10-CM | POA: Diagnosis not present

## 2020-07-12 DIAGNOSIS — F4323 Adjustment disorder with mixed anxiety and depressed mood: Secondary | ICD-10-CM | POA: Diagnosis not present

## 2020-07-12 DIAGNOSIS — F419 Anxiety disorder, unspecified: Secondary | ICD-10-CM | POA: Diagnosis not present

## 2020-07-20 DIAGNOSIS — R002 Palpitations: Secondary | ICD-10-CM | POA: Diagnosis not present

## 2020-07-20 DIAGNOSIS — Z79899 Other long term (current) drug therapy: Secondary | ICD-10-CM | POA: Diagnosis not present

## 2020-07-20 DIAGNOSIS — R079 Chest pain, unspecified: Secondary | ICD-10-CM | POA: Diagnosis not present

## 2020-07-20 DIAGNOSIS — R0789 Other chest pain: Secondary | ICD-10-CM | POA: Diagnosis not present

## 2020-07-20 DIAGNOSIS — I10 Essential (primary) hypertension: Secondary | ICD-10-CM | POA: Diagnosis not present

## 2020-07-25 DIAGNOSIS — F4323 Adjustment disorder with mixed anxiety and depressed mood: Secondary | ICD-10-CM | POA: Diagnosis not present

## 2020-07-27 ENCOUNTER — Encounter: Payer: Self-pay | Admitting: *Deleted

## 2020-07-27 ENCOUNTER — Encounter: Payer: Self-pay | Admitting: Cardiology

## 2020-07-27 DIAGNOSIS — I10 Essential (primary) hypertension: Secondary | ICD-10-CM | POA: Insufficient documentation

## 2020-07-27 DIAGNOSIS — R0789 Other chest pain: Secondary | ICD-10-CM

## 2020-07-27 HISTORY — DX: Other chest pain: R07.89

## 2020-08-09 DIAGNOSIS — F4323 Adjustment disorder with mixed anxiety and depressed mood: Secondary | ICD-10-CM | POA: Diagnosis not present

## 2020-08-19 DIAGNOSIS — F419 Anxiety disorder, unspecified: Secondary | ICD-10-CM | POA: Insufficient documentation

## 2020-08-19 DIAGNOSIS — E669 Obesity, unspecified: Secondary | ICD-10-CM | POA: Insufficient documentation

## 2020-08-19 DIAGNOSIS — O1490 Unspecified pre-eclampsia, unspecified trimester: Secondary | ICD-10-CM | POA: Insufficient documentation

## 2020-08-19 DIAGNOSIS — O24419 Gestational diabetes mellitus in pregnancy, unspecified control: Secondary | ICD-10-CM | POA: Insufficient documentation

## 2020-08-24 NOTE — Progress Notes (Signed)
Cardiology Office Note:    Date:  08/25/2020   ID:  Taylor [name], DOB 1970-11-25, MRN [id number]  PCP:  Noni Saupe, MD  Cardiologist:  Norman Herrlich, MD   Referring MD: Noni Saupe, MD  ASSESSMENT:    1. Chest pain of uncertain etiology   2. RBBB   3. Primary hypertension   4. Systolic murmur    PLAN:    In order of problems listed above:  Her chest pain is best described as nonanginal however she has risk factors including age hypertension family history and we will will do is a coronary artery calcium score if it is greater then 75th percentile she would need ischemia evaluation. See above Stable BP at target continue current treatment Physical exam age of onset strongly suggest a bicuspid aortic valve we will access her old records organ to recheck an echocardiogram as the finding would change her management  Next appointment 4 weeks   Medication Adjustments/Labs and Tests Ordered: Current medicines are reviewed at length with the patient today.  Concerns regarding medicines are outlined above.  Orders Placed This Encounter  Procedures   CT CARDIAC SCORING (SELF PAY ONLY)   ECHOCARDIOGRAM COMPLETE   No orders of the defined types were placed in this encounter.    Chief Complaint  Patient presents with   Palpitations   Chest Pain   Hospitalization Follow-up    History of Present Illness:    Taylor [name] is a 50 y.o. female with a history of hypertension who is being seen today for the evaluation of palpitation and chest pain at the request of Arvin Collard II, MD.  There is chart notation that she had a normal stress echo test ordered by Dr.Arida in 2012.  Chest x-ray 07/20/2020 was normal  She was seen in the emergency room [facility] 07/20/2020 complaining of chest pain her blood pressure in the ED was 165/87 CBC showed a hemoglobin of 14.5 normal BMP was normal creatinine 0.7 GFR greater than 60 cc sodium 135 potassium 3.6  troponin in the ED was undetectable and her EKG in the emergency room I independently reviewed showed sinus rhythm right bundle branch block.  Her chest pain was felt to be noncardiac in etiology low risk for increase the dose of her calcium channel blocker and was discharged from the emergency room  The day she went to the emergency room for several hours she had nonexertional chest pain unrelieved by rest left precordium tightness.  No radiation shortness of breath palpitation it was not pleuritic it was moderate in intensity but it caused her to be apprehensive.  No associated cough wheezing or fever.  She has had no chest wall tenderness or GI symptoms.  In all honesty she thinks much of it was related to anxiety and stress. 10 years ago when her father died at age 67 of heart disease she had similar symptoms tells me she had both an echo and a stress test at [facility] that was reassuring. She tells me she was told she had a heart murmur during her adult years. She has no family history of valvular heart disease. Her family history is noteworthy for father dying at age 72 of myocardial infarction.  I was able to review stress echocardiogram done at [facility] 04/13/2010 showing no evidence of ischemia good exercise tolerance 11.5 METS normal blood pressure response.  She did not have a detailed evaluation for valvular heart disease. Past Medical  History:  Diagnosis Date   Anxiety    Benign essential HTN    Chest discomfort 07/27/2020   Gestational diabetes    Obesity    Preeclampsia     Past Surgical History:  Procedure Laterality Date   CESAREAN SECTION  1999 / 2002   X2   ENDOMETRIAL ABLATION  2008   NASAL SEPTUM SURGERY     X2   SPINAL FUSION  2005   L4-5  X2    Current Medications: Current Meds  Medication Sig   ALPRAZolam (XANAX) 0.25 MG tablet Take 0.25 mg by mouth at bedtime as needed for anxiety.   amLODipine (NORVASC) 5 MG tablet Take 5 mg by mouth daily.    Ashwagandha 500 MG CAPS Take 1 capsule by mouth daily.   busPIRone (BUSPAR) 15 MG tablet Take 15 mg by mouth 2 (two) times daily.   fexofenadine (ALLEGRA) 180 MG tablet Take 180 mg by mouth daily.   fluticasone (FLONASE) 50 MCG/ACT nasal spray Place 2 sprays into both nostrils daily.   Melatonin 1 MG CAPS Take 1 capsule by mouth at bedtime as needed (sleep).   Multiple Vitamin (MULTIVITAMIN) capsule Take 1 capsule by mouth daily.   Omega-3 Fatty Acids (FISH OIL) 1000 MG CAPS Take 1 capsule by mouth daily.   spironolactone (ALDACTONE) 25 MG tablet Take 25 mg by mouth daily.     Allergies:   Celexa [citalopram], Cymbalta [duloxetine hcl], and Hydrochlorothiazide   Social History   Socioeconomic History   Marital status: Married    Spouse name: Not on file   Number of children: 2   Years of education: Not on file   Highest education level: Not on file  Occupational History   Occupation: office work  Tobacco Use   Smoking status: Never   Smokeless tobacco: Never  Substance and Sexual Activity   Alcohol use: No    Comment: Rare   Drug use: No   Sexual activity: Not on file  Other Topics Concern   Not on file  Social History Narrative   Not on file   Social Determinants of Health   Financial Resource Strain: Not on file  Food Insecurity: Not on file  Transportation Needs: Not on file  Physical Activity: Not on file  Stress: Not on file  Social Connections: Not on file     Family History: The patient's family history includes Heart attack in her father and another family member; Heart disease in an other family member; Hypercholesterolemia in her father and mother; Hypertension in her brother, father, and another family member; Thyroid disease in her mother.  ROS:   ROS Please see the history of present illness.     All other systems reviewed and are negative.  EKGs/Labs/Other Studies Reviewed:    The following studies were reviewed today:    Physical Exam:     VS:  BP 110/80 (BP Location: Left Arm, Patient Position: Sitting, Cuff Size: Normal)   Pulse 88   Ht 5\' 6"  (1.676 m)   Wt 181 lb (82.1 kg)   SpO2 96%   BMI 29.21 kg/m     Wt Readings from Last 3 Encounters:  08/25/20 181 lb (82.1 kg)  07/12/20 195 lb (88.5 kg)     GEN:  Well nourished, well developed in no acute distress HEENT: Normal NECK: No JVD; No carotid bruits LYMPHATICS: No lymphadenopathy CARDIAC: She has a grade 2/6 midsystolic ejection murmur rating into the right clavicular area no ejection click no  aortic regurgitation I suspect she has a bicuspid aortic valve RRR, no murmurs, rubs, gallops RESPIRATORY:  Clear to auscultation without rales, wheezing or rhonchi  ABDOMEN: Soft, non-tender, non-distended MUSCULOSKELETAL:  No edema; No deformity  SKIN: Warm and dry NEUROLOGIC:  Alert and oriented x 3 PSYCHIATRIC:  Normal affect     Signed, Norman Herrlich, MD  08/25/2020 4:10 PM    [facility]

## 2020-08-25 ENCOUNTER — Encounter: Payer: Self-pay | Admitting: Cardiology

## 2020-08-25 ENCOUNTER — Other Ambulatory Visit: Payer: Self-pay

## 2020-08-25 ENCOUNTER — Ambulatory Visit: Payer: BC Managed Care – PPO | Admitting: Cardiology

## 2020-08-25 VITALS — BP 110/80 | HR 88 | Ht 66.0 in | Wt 181.0 lb

## 2020-08-25 DIAGNOSIS — I10 Essential (primary) hypertension: Secondary | ICD-10-CM | POA: Diagnosis not present

## 2020-08-25 DIAGNOSIS — R011 Cardiac murmur, unspecified: Secondary | ICD-10-CM

## 2020-08-25 DIAGNOSIS — I4510 Unspecified right bundle-branch block: Secondary | ICD-10-CM

## 2020-08-25 DIAGNOSIS — R079 Chest pain, unspecified: Secondary | ICD-10-CM

## 2020-08-25 NOTE — Patient Instructions (Signed)
Medication Instructions:  Your physician recommends that you continue on your current medications as directed. Please refer to the Current Medication list given to you today.  *If you need a refill on your cardiac medications before your next appointment, please call your pharmacy*   Lab Work: None If you have labs (blood work) drawn today and your tests are completely normal, you will receive your results only by: MyChart Message (if you have MyChart) OR A paper copy in the mail If you have any lab test that is abnormal or we need to change your treatment, we will call you to review the results.   Testing/Procedures: Your physician has requested that you have an echocardiogram. Echocardiography is a painless test that uses sound waves to create images of your heart. It provides your doctor with information about the size and shape of your heart and how well your heart's chambers and valves are working. This procedure takes approximately one hour. There are no restrictions for this procedure.  We have placed the order for you to have a CT calcium score. They will call you to schedule this test.    Follow-Up: At [facility], you and your health needs are our priority.  As part of our continuing mission to provide you with exceptional heart care, we have created designated Provider Care Teams.  These Care Teams include your primary Cardiologist (physician) and Advanced Practice Providers (APPs -  Physician Assistants and Nurse Practitioners) who all work together to provide you with the care you need, when you need it.  We recommend signing up for the patient portal called "MyChart".  Sign up information is provided on this After Visit Summary.  MyChart is used to connect with patients for Virtual Visits (Telemedicine).  Patients are able to view lab/test results, encounter notes, upcoming appointments, etc.  Non-urgent messages can be sent to your provider as well.   To learn more about  what you can do with MyChart, go to [email or web address].    Your next appointment:   1 month(s)  The format for your next appointment:   In Person  Provider:   Norman Herrlich, MD   Other Instructions

## 2020-08-30 DIAGNOSIS — F4323 Adjustment disorder with mixed anxiety and depressed mood: Secondary | ICD-10-CM | POA: Diagnosis not present

## 2020-09-14 ENCOUNTER — Telehealth: Payer: Self-pay

## 2020-09-14 ENCOUNTER — Ambulatory Visit (INDEPENDENT_AMBULATORY_CARE_PROVIDER_SITE_OTHER): Payer: BC Managed Care – PPO

## 2020-09-14 ENCOUNTER — Other Ambulatory Visit: Payer: Self-pay

## 2020-09-14 DIAGNOSIS — R079 Chest pain, unspecified: Secondary | ICD-10-CM | POA: Diagnosis not present

## 2020-09-14 LAB — ECHOCARDIOGRAM COMPLETE
Area-P 1/2: 3.4 cm2
S' Lateral: 2.9 cm

## 2020-09-14 NOTE — Telephone Encounter (Signed)
Spoke with patient regarding results and recommendation.  Patient verbalizes understanding and is agreeable to plan of care. Advised patient to call back with any issues or concerns.  

## 2020-09-14 NOTE — Telephone Encounter (Signed)
-----   Message from Brian J Munley, MD sent at 09/14/2020  1:51 PM EDT ----- Normal or stable result  Good echocardiogram normal 

## 2020-09-14 NOTE — Telephone Encounter (Signed)
-----   Message from Baldo Daub, MD sent at 09/14/2020  1:51 PM EDT ----- Normal or stable result  Good echocardiogram normal

## 2020-09-14 NOTE — Telephone Encounter (Signed)
Patient notified of results.

## 2020-09-14 NOTE — Progress Notes (Signed)
Complete echocardiogram performed.  Jimmy Katherin Ramey RDCS, RVT  

## 2020-09-20 DIAGNOSIS — F4323 Adjustment disorder with mixed anxiety and depressed mood: Secondary | ICD-10-CM | POA: Diagnosis not present

## 2020-09-23 ENCOUNTER — Other Ambulatory Visit: Payer: Self-pay

## 2020-09-23 ENCOUNTER — Telehealth: Payer: Self-pay

## 2020-09-23 ENCOUNTER — Ambulatory Visit (INDEPENDENT_AMBULATORY_CARE_PROVIDER_SITE_OTHER)
Admission: RE | Admit: 2020-09-23 | Discharge: 2020-09-23 | Disposition: A | Discharge status: Home / Self Care | Payer: Self-pay | Source: Ambulatory Visit | Attending: Cardiology | Admitting: Cardiology

## 2020-09-23 DIAGNOSIS — I10 Essential (primary) hypertension: Secondary | ICD-10-CM

## 2020-09-23 NOTE — Telephone Encounter (Signed)
Spoke with patient regarding results and recommendation.  Patient verbalizes understanding and is agreeable to plan of care. Advised patient to call back with any issues or concerns.  

## 2020-09-23 NOTE — Telephone Encounter (Signed)
-----   Message from Baldo Daub, MD sent at 09/23/2020 12:57 PM EDT ----- The coronary artery calcium score is 0.  This is a normal test.  I went back looked at her echocardiogram her aorta is normal and she does not need another imaging such as a gated CTA or MRI done.  Good result normal

## 2020-09-28 ENCOUNTER — Encounter: Payer: Self-pay | Admitting: Cardiology

## 2020-09-28 ENCOUNTER — Other Ambulatory Visit: Payer: Self-pay

## 2020-09-28 ENCOUNTER — Ambulatory Visit: Payer: BC Managed Care – PPO | Admitting: Cardiology

## 2020-09-28 VITALS — BP 121/78 | HR 84 | Ht 66.0 in | Wt 175.2 lb

## 2020-09-28 DIAGNOSIS — I7789 Other specified disorders of arteries and arterioles: Secondary | ICD-10-CM | POA: Diagnosis not present

## 2020-09-28 DIAGNOSIS — I10 Essential (primary) hypertension: Secondary | ICD-10-CM | POA: Diagnosis not present

## 2020-09-28 NOTE — Patient Instructions (Addendum)
Medication Instructions:  Your physician recommends that you continue on your current medications as directed. Please refer to the Current Medication list given to you today.  *If you need a refill on your cardiac medications before your next appointment, please call your pharmacy*   Lab Work: None If you have labs (blood work) drawn today and your tests are completely normal, you will receive your results only by: MyChart Message (if you have MyChart) OR A paper copy in the mail If you have any lab test that is abnormal or we need to change your treatment, we will call you to review the results.   Testing/Procedures: We have put in the order for you to have a non-contrast CT of your chest next year. They will call you closer to time to get this done.    Follow-Up: At [facility], you and your health needs are our priority.  As part of our continuing mission to provide you with exceptional heart care, we have created designated Provider Care Teams.  These Care Teams include your primary Cardiologist (physician) and Advanced Practice Providers (APPs -  Physician Assistants and Nurse Practitioners) who all work together to provide you with the care you need, when you need it.  We recommend signing up for the patient portal called "MyChart".  Sign up information is provided on this After Visit Summary.  MyChart is used to connect with patients for Virtual Visits (Telemedicine).  Patients are able to view lab/test results, encounter notes, upcoming appointments, etc.  Non-urgent messages can be sent to your provider as well.   To learn more about what you can do with MyChart, go to [email or web address].    Your next appointment:   1 year(s)  The format for your next appointment:   In Person  Provider:   Norman Herrlich, MD   Other Instructions  Tips to measure your blood pressure correctly  To determine whether you have hypertension, a medical professional will take a blood  pressure reading. How you prepare for the test, the position of your arm, and other factors can change a blood pressure reading by 10% or more. That could be enough to hide high blood pressure, start you on a drug you don't really need, or lead your doctor to incorrectly adjust your medications. National and international guidelines offer specific instructions for measuring blood pressure. If a doctor, nurse, or medical assistant isn't doing it right, don't hesitate to ask him or her to get with the guidelines. Here's what you can do to ensure a correct reading:  Don't drink a caffeinated beverage or smoke during the 30 minutes before the test.  Sit quietly for five minutes before the test begins.  During the measurement, sit in a chair with your feet on the floor and your arm supported so your elbow is at about heart level.  The inflatable part of the cuff should completely cover at least 80% of your upper arm, and the cuff should be placed on bare skin, not over a shirt.  Don't talk during the measurement.  Have your blood pressure measured twice, with a brief break in between. If the readings are different by 5 points or more, have it done a third time. There are times to break these rules. If you sometimes feel lightheaded when getting out of bed in the morning or when you stand after sitting, you should have your blood pressure checked while seated and then while standing to see if it falls from one position  to the next. Because blood pressure varies throughout the day, your doctor will rarely diagnose hypertension on the basis of a single reading. Instead, he or she will want to confirm the measurements on at least two occasions, usually within a few weeks of one another. The exception to this rule is if you have a blood pressure reading of 180/110 mm Hg or higher. A result this high usually calls for prompt treatment. It's also a good idea to have your blood pressure measured in both arms at least  once, since the reading in one arm (usually the right) may be higher than that in the left. A 2014 study in The American Journal of Medicine of nearly 3,400 people found average arm- to-arm differences in systolic blood pressure of about 5 points. The higher number should be used to make treatment decisions. In 2017, new guidelines from the American Heart Association, the [employer] of Cardiology, and nine other health organizations lowered the diagnosis of high blood pressure to 130/80 mm Hg or higher for all adults. The guidelines also redefined the various blood pressure categories to now include normal, elevated, Stage 1 hypertension, Stage 2 hypertension, and hypertensive crisis (see "Blood pressure categories"). Blood pressure categories  Blood pressure category SYSTOLIC (upper number)  DIASTOLIC (lower number)  Normal Less than 120 mm Hg and Less than 80 mm Hg  Elevated 120-129 mm Hg and Less than 80 mm Hg  High blood pressure: Stage 1 hypertension 130-139 mm Hg or 80-89 mm Hg  High blood pressure: Stage 2 hypertension 140 mm Hg or higher or 90 mm Hg or higher  Hypertensive crisis (consult your doctor immediately) Higher than 180 mm Hg and/or Higher than 120 mm Hg  Source: American Heart Association and American Stroke Association. For more on getting your blood pressure under control, buy Controlling Your Blood Pressure, a Special Health Report from [facility].

## 2020-09-28 NOTE — Progress Notes (Signed)
Cardiology Office Note:    Date:  09/28/2020   ID:  Taylor [name], DOB 1970/12/18, MRN [id number]  PCP:  Noni Saupe, MD  Cardiologist:  Norman Herrlich, MD    Referring MD: Noni Saupe, MD    ASSESSMENT:    1. Enlarged thoracic aorta (HCC)   2. Primary hypertension    PLAN:    In order of problems listed above:  She has mild to moderate enlargement of thoracic aorta in the context of hypertensive disorder pregnancy and subsequent essential hypertension.  She does not have genetic aortopathy generally this does not progress to needing correction we will plan to do a follow-up gated CT in 1 year continue her current antihypertensive she is well controlled and become actively involved screening her blood pressure at home I encouraged her to continue her healthy lifestyle and regular exercise without restriction.  With a calcium score of 0 does not require lipid-lowering statin at this time I would not do an ischemia evaluation   Next appointment: 1 year   Medication Adjustments/Labs and Tests Ordered: Current medicines are reviewed at length with the patient today.  Concerns regarding medicines are outlined above.  Orders Placed This Encounter  Procedures   CT Chest Wo Contrast   No orders of the defined types were placed in this encounter.   Chief complaint follow-up after cardiac testing  History of Present Illness:    Taylor [name] is a 50 y.o. female with a hx of hypertension palpitation chest pain last seen 08/25/2020. Compliance with diet, lifestyle and medications: Yes  Cardiac CT calcium scoring was performed 09/23/2020 with a calcium score of 0 and possible enlargement of the ascending aorta.  Echocardiogram 09/14/2020 shows normal left ventricular size wall thickness systolic function EF 60 to 65% and diastolic function.  Right ventricle was normal size and function with normal pulmonary artery pressures there is no valvular abnormality and aortic  root was normal in size.  Previous evaluation 2012 showed a normal stress echo with an exercise tolerance 11.5 METS.  Chest x-ray June of this year was normal.  She is feeling well back to her regular exercise running daily and has had no chest pain shortness of breath palpitation or syncope. She is very reassured by her coronary calcium score of 0 especially as her husband has a score of over 400. I reviewed her CTA findings with mild to moderate enlargement of her aorta in the context of hypertensive disorder of pregnancy and hypertension.  She does not have genetic aortopathy and I have asked her in 1 year to have a noncontrast gated CT done in follow-up.  Her aortic root is normal on echocardiogram.  In general this is not progressive and does not require intervention in the setting of controlled hypertension I have asked her to record her blood pressure with good technique several days a week. Past Medical History:  Diagnosis Date   Anxiety    Benign essential HTN    Chest discomfort 07/27/2020   Gestational diabetes    Obesity    Preeclampsia     Past Surgical History:  Procedure Laterality Date   CESAREAN SECTION  1999 / 2002   X2   ENDOMETRIAL ABLATION  2008   NASAL SEPTUM SURGERY     X2   SPINAL FUSION  2005   L4-5  X2    Current Medications: Current Meds  Medication Sig   ALPRAZolam (XANAX) 0.25 MG tablet Take 0.25 mg by  mouth at bedtime as needed for anxiety.   amLODipine (NORVASC) 5 MG tablet Take 5 mg by mouth daily.   Ashwagandha 500 MG CAPS Take 1 capsule by mouth daily.   busPIRone (BUSPAR) 15 MG tablet Take 15 mg by mouth 2 (two) times daily.   fexofenadine (ALLEGRA) 180 MG tablet Take 180 mg by mouth daily.   fluticasone (FLONASE) 50 MCG/ACT nasal spray Place 2 sprays into both nostrils daily.   Melatonin 1 MG CAPS Take 1 capsule by mouth at bedtime as needed (sleep).   Multiple Vitamin (MULTIVITAMIN) capsule Take 1 capsule by mouth daily.   Omega-3 Fatty Acids  (FISH OIL) 1000 MG CAPS Take 1 capsule by mouth daily.   spironolactone (ALDACTONE) 25 MG tablet Take 25 mg by mouth daily.     Allergies:   Celexa [citalopram], Cymbalta [duloxetine hcl], and Hydrochlorothiazide   Social History   Socioeconomic History   Marital status: Married    Spouse name: Not on file   Number of children: 2   Years of education: Not on file   Highest education level: Not on file  Occupational History   Occupation: office work  Tobacco Use   Smoking status: Never   Smokeless tobacco: Never  Substance and Sexual Activity   Alcohol use: No    Comment: Rare   Drug use: No   Sexual activity: Not on file  Other Topics Concern   Not on file  Social History Narrative   Not on file   Social Determinants of Health   Financial Resource Strain: Not on file  Food Insecurity: Not on file  Transportation Needs: Not on file  Physical Activity: Not on file  Stress: Not on file  Social Connections: Not on file     Family History: The patient's family history includes Heart attack in her father and another family member; Heart disease in an other family member; Hypercholesterolemia in her father and mother; Hypertension in her brother, father, and another family member; Thyroid disease in her mother. ROS:   Please see the history of present illness.    All other systems reviewed and are negative.  EKGs/Labs/Other Studies Reviewed:    The following studies were reviewed today:   Physical Exam:    VS:  BP 121/78 (BP Location: Left Arm, Patient Position: Sitting)   Pulse 84   Ht 5\' 6"  (1.676 m)   Wt 175 lb 3.2 oz (79.5 kg)   SpO2 97%   BMI 28.28 kg/m     Wt Readings from Last 3 Encounters:  09/28/20 175 lb 3.2 oz (79.5 kg)  08/25/20 181 lb (82.1 kg)  07/12/20 195 lb (88.5 kg)     GEN:  Well nourished, well developed in no acute distress HEENT: Normal NECK: No JVD; No carotid bruits LYMPHATICS: No lymphadenopathy CARDIAC: RRR, no murmurs, rubs,  gallops RESPIRATORY:  Clear to auscultation without rales, wheezing or rhonchi  ABDOMEN: Soft, non-tender, non-distended MUSCULOSKELETAL:  No edema; No deformity  SKIN: Warm and dry NEUROLOGIC:  Alert and oriented x 3 PSYCHIATRIC:  Normal affect    Signed, 07/14/20, MD  09/28/2020 10:54 AM    [facility]

## 2020-10-18 DIAGNOSIS — F4323 Adjustment disorder with mixed anxiety and depressed mood: Secondary | ICD-10-CM | POA: Diagnosis not present

## 2020-11-15 DIAGNOSIS — F4323 Adjustment disorder with mixed anxiety and depressed mood: Secondary | ICD-10-CM | POA: Diagnosis not present

## 2020-11-24 DIAGNOSIS — R21 Rash and other nonspecific skin eruption: Secondary | ICD-10-CM | POA: Diagnosis not present

## 2020-11-24 DIAGNOSIS — W57XXXA Bitten or stung by nonvenomous insect and other nonvenomous arthropods, initial encounter: Secondary | ICD-10-CM | POA: Diagnosis not present

## 2020-12-05 DIAGNOSIS — F4323 Adjustment disorder with mixed anxiety and depressed mood: Secondary | ICD-10-CM | POA: Diagnosis not present

## 2020-12-05 DIAGNOSIS — Z6827 Body mass index (BMI) 27.0-27.9, adult: Secondary | ICD-10-CM | POA: Diagnosis not present

## 2020-12-05 DIAGNOSIS — M25571 Pain in right ankle and joints of right foot: Secondary | ICD-10-CM | POA: Diagnosis not present

## 2020-12-19 DIAGNOSIS — Z1151 Encounter for screening for human papillomavirus (HPV): Secondary | ICD-10-CM | POA: Diagnosis not present

## 2020-12-19 DIAGNOSIS — N951 Menopausal and female climacteric states: Secondary | ICD-10-CM | POA: Diagnosis not present

## 2020-12-19 DIAGNOSIS — Z01419 Encounter for gynecological examination (general) (routine) without abnormal findings: Secondary | ICD-10-CM | POA: Diagnosis not present

## 2020-12-19 DIAGNOSIS — Z1231 Encounter for screening mammogram for malignant neoplasm of breast: Secondary | ICD-10-CM | POA: Diagnosis not present

## 2020-12-21 DIAGNOSIS — R12 Heartburn: Secondary | ICD-10-CM | POA: Diagnosis not present

## 2020-12-21 DIAGNOSIS — Z01818 Encounter for other preprocedural examination: Secondary | ICD-10-CM | POA: Diagnosis not present

## 2020-12-22 DIAGNOSIS — F4323 Adjustment disorder with mixed anxiety and depressed mood: Secondary | ICD-10-CM | POA: Diagnosis not present

## 2021-01-10 DIAGNOSIS — Z1211 Encounter for screening for malignant neoplasm of colon: Secondary | ICD-10-CM | POA: Diagnosis not present

## 2021-01-16 DIAGNOSIS — J329 Chronic sinusitis, unspecified: Secondary | ICD-10-CM | POA: Diagnosis not present

## 2021-01-16 DIAGNOSIS — J40 Bronchitis, not specified as acute or chronic: Secondary | ICD-10-CM | POA: Diagnosis not present

## 2021-01-27 DIAGNOSIS — Z1231 Encounter for screening mammogram for malignant neoplasm of breast: Secondary | ICD-10-CM | POA: Diagnosis not present

## 2021-03-14 DIAGNOSIS — I10 Essential (primary) hypertension: Secondary | ICD-10-CM | POA: Diagnosis not present

## 2021-03-14 DIAGNOSIS — H9190 Unspecified hearing loss, unspecified ear: Secondary | ICD-10-CM | POA: Diagnosis not present

## 2021-03-14 DIAGNOSIS — Z6828 Body mass index (BMI) 28.0-28.9, adult: Secondary | ICD-10-CM | POA: Diagnosis not present

## 2021-03-28 ENCOUNTER — Other Ambulatory Visit: Payer: Self-pay

## 2021-03-28 ENCOUNTER — Ambulatory Visit: Payer: BC Managed Care – PPO | Attending: Family Medicine | Admitting: Audiologist

## 2021-03-28 DIAGNOSIS — H903 Sensorineural hearing loss, bilateral: Secondary | ICD-10-CM | POA: Diagnosis not present

## 2021-03-28 NOTE — Procedures (Signed)
°  Outpatient Audiology and [facility] [address] [place], [place]  [address] [phone]  AUDIOLOGICAL  EVALUATION  NAME: Taylor [name]     DOB:   Feb 23, 1970      MRN: [id number]                                                                                     DATE: 03/28/2021     REFERENT: Noni Saupe, MD STATUS: Outpatient DIAGNOSIS: Mild Sensorineural Hearing Loss Bilateral     History: [name] was seen for an audiological evaluation. [name] is receiving a hearing evaluation due to concerns for her hearing after referring a screening at her primary care office. [name] has difficulty hearing in background noise, crowds, and when people are at a distance. This difficulty began gradually. No pain or pressure reported in either ear. However [name] has extensive history of sinus infections. Tinnitus denied in both ears. 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  Medical history negative for any health condition which is a risk factor for hearing loss. No other relevant case history reported.   Evaluation:  Otoscopy showed a clear view of the tympanic membranes, bilaterally Tympanometry results were consistent with normal middle ear pressure, bilaterally   Audiometric testing was completed using conventional audiometry with insert transducer. Speech Recognition Thresholds were consistent with pure tone averages. Word Recognition was good at conversation level. Pure tone thresholds show normal sloping after 3k Hz to a mild high frequency sensorineural hearing loss in both ears. Test results are consistent with mild age related hearing changes.  QuickSIN normal with 1.5dB SNR.  Results:  The test results were reviewed with [name]. She has a mild high frequency hearing loss in both ears. The loss is symmetric. She is not a hearing aid candidate. She just needs to protect her hearing and communicate face to face within five feet.  She will struggle to hear people at a distance or when they have their backs turned. Recommend another hearing test in two years.    Recommendations: 1.   No further audiologic testing is needed at this time unless future hearing concerns arise. Monitor hearing loss every other year.    [name]  Audiologist, Au.D., CCC-A 03/28/2021  2:31 PM  Cc: Noni Saupe, MD

## 2021-04-14 DIAGNOSIS — F419 Anxiety disorder, unspecified: Secondary | ICD-10-CM | POA: Diagnosis not present

## 2021-04-14 DIAGNOSIS — Z6828 Body mass index (BMI) 28.0-28.9, adult: Secondary | ICD-10-CM | POA: Diagnosis not present

## 2021-04-14 DIAGNOSIS — I10 Essential (primary) hypertension: Secondary | ICD-10-CM | POA: Diagnosis not present

## 2021-04-23 DIAGNOSIS — H66003 Acute suppurative otitis media without spontaneous rupture of ear drum, bilateral: Secondary | ICD-10-CM | POA: Diagnosis not present

## 2021-04-23 DIAGNOSIS — R051 Acute cough: Secondary | ICD-10-CM | POA: Diagnosis not present

## 2021-04-23 DIAGNOSIS — J069 Acute upper respiratory infection, unspecified: Secondary | ICD-10-CM | POA: Diagnosis not present

## 2021-04-25 DIAGNOSIS — J324 Chronic pansinusitis: Secondary | ICD-10-CM | POA: Diagnosis not present

## 2021-05-22 DIAGNOSIS — J324 Chronic pansinusitis: Secondary | ICD-10-CM | POA: Diagnosis not present

## 2021-05-22 DIAGNOSIS — J330 Polyp of nasal cavity: Secondary | ICD-10-CM | POA: Diagnosis not present

## 2021-06-12 DIAGNOSIS — J3489 Other specified disorders of nose and nasal sinuses: Secondary | ICD-10-CM | POA: Diagnosis not present

## 2021-06-12 DIAGNOSIS — J309 Allergic rhinitis, unspecified: Secondary | ICD-10-CM | POA: Diagnosis not present

## 2021-06-12 DIAGNOSIS — J324 Chronic pansinusitis: Secondary | ICD-10-CM | POA: Diagnosis not present

## 2021-06-12 DIAGNOSIS — J339 Nasal polyp, unspecified: Secondary | ICD-10-CM | POA: Diagnosis not present

## 2021-06-12 DIAGNOSIS — J331 Polypoid sinus degeneration: Secondary | ICD-10-CM | POA: Diagnosis not present

## 2021-06-12 DIAGNOSIS — J329 Chronic sinusitis, unspecified: Secondary | ICD-10-CM | POA: Diagnosis not present

## 2021-07-04 DIAGNOSIS — Z6829 Body mass index (BMI) 29.0-29.9, adult: Secondary | ICD-10-CM | POA: Diagnosis not present

## 2021-07-04 DIAGNOSIS — R590 Localized enlarged lymph nodes: Secondary | ICD-10-CM | POA: Diagnosis not present

## 2021-07-04 DIAGNOSIS — E049 Nontoxic goiter, unspecified: Secondary | ICD-10-CM | POA: Diagnosis not present

## 2021-07-14 DIAGNOSIS — E041 Nontoxic single thyroid nodule: Secondary | ICD-10-CM | POA: Diagnosis not present

## 2021-07-14 DIAGNOSIS — J339 Nasal polyp, unspecified: Secondary | ICD-10-CM | POA: Diagnosis not present

## 2021-07-14 DIAGNOSIS — J0100 Acute maxillary sinusitis, unspecified: Secondary | ICD-10-CM | POA: Diagnosis not present

## 2021-07-14 DIAGNOSIS — J331 Polypoid sinus degeneration: Secondary | ICD-10-CM | POA: Diagnosis not present

## 2021-07-18 ENCOUNTER — Other Ambulatory Visit: Payer: Self-pay | Admitting: Otolaryngology

## 2021-07-18 DIAGNOSIS — E041 Nontoxic single thyroid nodule: Secondary | ICD-10-CM

## 2021-07-31 ENCOUNTER — Ambulatory Visit
Admission: RE | Admit: 2021-07-31 | Discharge: 2021-07-31 | Disposition: A | Discharge status: Home / Self Care | Payer: BC Managed Care – PPO | Source: Ambulatory Visit | Attending: Otolaryngology | Admitting: Otolaryngology

## 2021-07-31 ENCOUNTER — Other Ambulatory Visit: Payer: BC Managed Care – PPO

## 2021-07-31 DIAGNOSIS — E041 Nontoxic single thyroid nodule: Secondary | ICD-10-CM

## 2021-08-04 DIAGNOSIS — E041 Nontoxic single thyroid nodule: Secondary | ICD-10-CM | POA: Diagnosis not present

## 2021-08-08 ENCOUNTER — Telehealth: Payer: Self-pay | Admitting: Cardiology

## 2021-08-08 NOTE — Telephone Encounter (Signed)
Patient transferred to central scheduling to make arrangements for CT Chest. Are we able to work her in for a follow up with Dr. Dulce Sellar sometime after CT? Patient plans to call back once CT has been scheduled. Please advise.

## 2021-08-14 ENCOUNTER — Ambulatory Visit (HOSPITAL_COMMUNITY)
Admission: RE | Admit: 2021-08-14 | Discharge: 2021-08-14 | Disposition: A | Discharge status: Home / Self Care | Payer: BC Managed Care – PPO | Source: Ambulatory Visit | Attending: Cardiology | Admitting: Cardiology

## 2021-08-14 DIAGNOSIS — I712 Thoracic aortic aneurysm, without rupture, unspecified: Secondary | ICD-10-CM | POA: Diagnosis not present

## 2021-08-14 DIAGNOSIS — I7789 Other specified disorders of arteries and arterioles: Secondary | ICD-10-CM | POA: Diagnosis not present

## 2021-10-03 DIAGNOSIS — F419 Anxiety disorder, unspecified: Secondary | ICD-10-CM | POA: Diagnosis not present

## 2021-10-03 DIAGNOSIS — Z6829 Body mass index (BMI) 29.0-29.9, adult: Secondary | ICD-10-CM | POA: Diagnosis not present

## 2021-10-03 DIAGNOSIS — Z1331 Encounter for screening for depression: Secondary | ICD-10-CM | POA: Diagnosis not present

## 2021-10-03 DIAGNOSIS — I10 Essential (primary) hypertension: Secondary | ICD-10-CM | POA: Diagnosis not present

## 2021-11-07 ENCOUNTER — Ambulatory Visit: Payer: BC Managed Care – PPO | Admitting: Cardiology

## 2021-11-10 DIAGNOSIS — E041 Nontoxic single thyroid nodule: Secondary | ICD-10-CM | POA: Diagnosis not present

## 2021-11-10 DIAGNOSIS — J324 Chronic pansinusitis: Secondary | ICD-10-CM | POA: Diagnosis not present

## 2021-11-10 DIAGNOSIS — J339 Nasal polyp, unspecified: Secondary | ICD-10-CM | POA: Diagnosis not present

## 2021-12-13 ENCOUNTER — Ambulatory Visit: Payer: BC Managed Care – PPO | Attending: Cardiology | Admitting: Cardiology

## 2021-12-13 ENCOUNTER — Encounter: Payer: Self-pay | Admitting: Cardiology

## 2021-12-13 VITALS — BP 130/82 | HR 83 | Ht 66.0 in | Wt 187.0 lb

## 2021-12-13 DIAGNOSIS — I10 Essential (primary) hypertension: Secondary | ICD-10-CM

## 2021-12-13 DIAGNOSIS — I4510 Unspecified right bundle-branch block: Secondary | ICD-10-CM

## 2021-12-13 DIAGNOSIS — I7789 Other specified disorders of arteries and arterioles: Secondary | ICD-10-CM | POA: Diagnosis not present

## 2021-12-13 NOTE — Progress Notes (Signed)
Cardiology Office Note:    Date:  12/13/2021   ID:  Taylor [name], DOB 05-Dec-1970, MRN [id number]  PCP:  Noni Saupe, MD  Cardiologist:  Norman Herrlich, MD    Referring MD: Noni Saupe, MD    ASSESSMENT:    1. Primary hypertension   2. Enlarged thoracic aorta (HCC)   3. RBBB    PLAN:    In order of problems listed above:  Her blood pressure is well controlled, not uncommonly in women the gait weight hypertension is hypertensive disorder pregnancy that she had and I continue her combined treatment with amlodipine spironolactone continue to trend record her blood pressure at home with goal less than 130/80 and I stressed activity and her goal is to have 10,000 steps per day.  I will go ahead and recheck her labs today as well as a lipid profile She has no aortopathy CT shows a normal thoracic aorta Same pattern pattern compared to a year ago unassociated with structural heart disease I think she can return to care with a primary care physician she agrees and I will see back in the office as needed     Medication Adjustments/Labs and Tests Ordered: Current medicines are reviewed at length with the patient today.  Concerns regarding medicines are outlined above.  Orders Placed This Encounter  Procedures   Comprehensive metabolic panel   Lipid panel   EKG 12-Lead   No orders of the defined types were placed in this encounter.   Chief Complaint  Patient presents with   Annual Exam   Hypertension    History of Present Illness:    Taylor [name] is a 51 y.o. female with a hx of hypertension coronary artery calcium score of 0 and question of enlargement of her aorta last seen 09/28/2020.  Cardiac CT calcium scoring was performed 09/23/2020 with a calcium score of 0 and possible enlargement of the ascending aorta.    Echocardiogram 09/14/2020 shows normal left ventricular size wall thickness systolic function EF 60 to 65% and diastolic function.  Right  ventricle was normal size and function with normal pulmonary artery pressures there is no valvular abnormality and aortic root was normal in size.   1. GLS -14.8. Left ventricular ejection fraction, by estimation, is 60 to  65%. The left ventricle has normal function. The left ventricle has no  regional wall motion abnormalities. Left ventricular diastolic parameters  were normal.   2. Right ventricular systolic function is normal. The right ventricular  size is normal. There is normal pulmonary artery systolic pressure.   3. The mitral valve is normal in structure. No evidence of mitral valve  regurgitation. No evidence of mitral stenosis.   4. The aortic valve is normal in structure. Aortic valve regurgitation is  not visualized. No aortic stenosis is present.   5. The inferior vena cava is normal in size with greater than 50%  respiratory variability, suggesting right atrial pressure of 3 mmHg.   Chest CT 08/14/2021 in particular showed the thoracic aorta being normal in size maximum diameter ascending aorta 3.4 cm. IMPRESSION: 1. No evidence of aortic dilatation. 2.  No acute process in the chest.  Compliance with diet, lifestyle and medications: Yes  She has had a good year blood pressure is well controlled tolerates her medications and has had no cardiovascular symptoms of shortness of breath chest pain palpitation or syncope she does have a tendency to have mild dependent edema better with spironolactone. Past  Medical History:  Diagnosis Date   Anxiety    Benign essential HTN    Chest discomfort 07/27/2020   Gestational diabetes    Obesity    Preeclampsia     Past Surgical History:  Procedure Laterality Date   CESAREAN SECTION  1999 / 2002   X2   ENDOMETRIAL ABLATION  2008   NASAL SEPTUM SURGERY     X2   SPINAL FUSION  2005   L4-5  X2    Current Medications: Current Meds  Medication Sig   ALPRAZolam (XANAX) 0.25 MG tablet Take 0.25 mg by mouth at bedtime as needed  for anxiety.   amLODipine (NORVASC) 5 MG tablet Take 5 mg by mouth daily.   Ashwagandha 500 MG CAPS Take 1 capsule by mouth daily.   busPIRone (BUSPAR) 15 MG tablet Take 15 mg by mouth 2 (two) times daily.   fexofenadine (ALLEGRA) 180 MG tablet Take 180 mg by mouth daily.   fluticasone (FLONASE) 50 MCG/ACT nasal spray Place 2 sprays into both nostrils daily.   lamoTRIgine (LAMICTAL) 100 MG tablet Take 100 mg by mouth daily.   Melatonin 1 MG CAPS Take 1 capsule by mouth at bedtime as needed (sleep).   Multiple Vitamin (MULTIVITAMIN) capsule Take 1 capsule by mouth daily.   spironolactone (ALDACTONE) 25 MG tablet Take 25 mg by mouth daily.     Allergies:   Celexa [citalopram], Cymbalta [duloxetine hcl], and Hydrochlorothiazide   Social History   Socioeconomic History   Marital status: Married    Spouse name: Not on file   Number of children: 2   Years of education: Not on file   Highest education level: Not on file  Occupational History   Occupation: office work  Tobacco Use   Smoking status: Never   Smokeless tobacco: Never  Substance and Sexual Activity   Alcohol use: No    Comment: Rare   Drug use: No   Sexual activity: Not on file  Other Topics Concern   Not on file  Social History Narrative   Not on file   Social Determinants of Health   Financial Resource Strain: Not on file  Food Insecurity: Not on file  Transportation Needs: Not on file  Physical Activity: Not on file  Stress: Not on file  Social Connections: Not on file     Family History: The patient's family history includes Heart attack in her father and another family member; Heart disease in an other family member; Hypercholesterolemia in her father and mother; Hypertension in her brother, father, and another family member; Thyroid disease in her mother. ROS:   Please see the history of present illness.    All other systems reviewed and are negative.  EKGs/Labs/Other Studies Reviewed:    The  following studies were reviewed today:  EKG:  EKG ordered today and personally reviewed.  The ekg ordered today demonstrates sinus rhythm right bundle branch block  Recent Labs: 06/13/2020 cholesterol 197 triglycerides 218 creatinine 0.8 potassium 3.8  Physical Exam:    VS:  BP 130/82 (BP Location: Right Arm, Patient Position: Sitting, Cuff Size: Normal)   Pulse 83   Ht 5\' 6"  (1.676 m)   Wt 187 lb (84.8 kg)   SpO2 98%   BMI 30.18 kg/m     Wt Readings from Last 3 Encounters:  12/13/21 187 lb (84.8 kg)  09/28/20 175 lb 3.2 oz (79.5 kg)  08/25/20 181 lb (82.1 kg)     GEN:  Well nourished, well developed  in no acute distress HEENT: Normal NECK: No JVD; No carotid bruits LYMPHATICS: No lymphadenopathy CARDIAC: RRR, no murmurs, rubs, gallops RESPIRATORY:  Clear to auscultation without rales, wheezing or rhonchi  ABDOMEN: Soft, non-tender, non-distended MUSCULOSKELETAL:  No edema; No deformity  SKIN: Warm and dry NEUROLOGIC:  Alert and oriented x 3 PSYCHIATRIC:  Normal affect    Signed, Shirlee More, MD  12/13/2021 4:49 PM    [place]

## 2021-12-13 NOTE — Patient Instructions (Addendum)
[facility]  Tips to measure your blood pressure correctly  To determine whether you have hypertension, a medical professional will take a blood pressure reading. How you prepare for the test, the position of your arm, and other factors can change a blood pressure reading by 10% or more. That could be enough to hide high blood pressure, start you on a drug you don't really need, or lead your doctor to incorrectly adjust your medications. National and international guidelines offer specific instructions for measuring blood pressure. If a doctor, nurse, or medical assistant isn't doing it right, don't hesitate to ask him or her to get with the guidelines. Here's what you can do to ensure a correct reading:  Don't drink a caffeinated beverage or smoke during the 30 minutes before the test.  Sit quietly for five minutes before the test begins.  During the measurement, sit in a chair with your feet on the floor and your arm supported so your elbow is at about heart level.  The inflatable part of the cuff should completely cover at least 80% of your upper arm, and the cuff should be placed on bare skin, not over a shirt.  Don't talk during the measurement.  Have your blood pressure measured twice, with a brief break in between. If the readings are different by 5 points or more, have it done a third time. There are times to break these rules. If you sometimes feel lightheaded when getting out of bed in the morning or when you stand after sitting, you should have your blood pressure checked while seated and then while standing to see if it falls from one position to the next. Because blood pressure varies throughout the day, your doctor will rarely diagnose hypertension on the basis of a single reading. Instead, he or she will want to confirm the measurements on at least two occasions, usually within a few weeks of one another. The exception to this rule is if you have a blood pressure reading of 180/110 mm Hg  or higher. A result this high usually calls for prompt treatment. It's also a good idea to have your blood pressure measured in both arms at least once, since the reading in one arm (usually the right) may be higher than that in the left. A 2014 study in The American Journal of Medicine of nearly 3,400 people found average arm- to-arm differences in systolic blood pressure of about 5 points. The higher number should be used to make treatment decisions. In 2017, new guidelines from the [place], the [employer] of Cardiology, and nine other health organizations lowered the diagnosis of high blood pressure to 130/80 mm Hg or higher for all adults. The guidelines also redefined the various blood pressure categories to now include normal, elevated, Stage 1 hypertension, Stage 2 hypertension, and hypertensive crisis (see "Blood pressure categories"). Blood pressure categories  Blood pressure category SYSTOLIC (upper number)  DIASTOLIC (lower number)  Normal Less than 120 mm Hg and Less than 80 mm Hg  Elevated 120-129 mm Hg and Less than 80 mm Hg  High blood pressure: Stage 1 hypertension 130-139 mm Hg or 80-89 mm Hg  High blood pressure: Stage 2 hypertension 140 mm Hg or higher or 90 mm Hg or higher  Hypertensive crisis (consult your doctor immediately) Higher than 180 mm Hg and/or Higher than 120 mm Hg  Source: American Heart Association and American Stroke Association. For more on getting your blood pressure under control, buy Controlling Your Blood  Pressure, a Special Health Report from [facility].   Medication Instructions:  No medication changes. *If you need a refill on your cardiac medications before your next appointment, please call your pharmacy*   Lab Work: Your physician recommends that you have a CMP and lipids today.  If you have labs (blood work) drawn today and your tests are completely normal, you will receive your results only by: [place] (if you have MyChart) OR A paper copy in the mail If you have any lab test that is abnormal or we need to change your treatment, we will call you to review the results.   Testing/Procedures: None ordered   Follow-Up: At [facility], you and your health needs are our priority.  As part of our continuing mission to provide you with exceptional heart care, we have created designated Provider Care Teams.  These Care Teams include your primary Cardiologist (physician) and Advanced Practice Providers (APPs -  Physician Assistants and Nurse Practitioners) who all work together to provide you with the care you need, when you need it.  We recommend signing up for the patient portal called "MyChart".  Sign up information is provided on this After Visit Summary.  MyChart is used to connect with patients for Virtual Visits (Telemedicine).  Patients are able to view lab/test results, encounter notes, upcoming appointments, etc.  Non-urgent messages can be sent to your provider as well.   To learn more about what you can do with MyChart, go to [email or web address].    Your next appointment:   As needed  The format for your next appointment:   In Person  Provider:   Shirlee More, MD   Other Instructions NA

## 2021-12-14 LAB — LIPID PANEL
Chol/HDL Ratio: 3.6 ratio (ref 0.0–4.4)
Cholesterol, Total: 189 mg/dL (ref 100–199)
HDL: 52 mg/dL (ref >39)
LDL Chol Calc (NIH): 126 mg/dL — ABNORMAL HIGH (ref 0–99)
Triglycerides: 57 mg/dL (ref 0–149)
VLDL Cholesterol Cal: 11 mg/dL (ref 5–40)

## 2021-12-14 LAB — COMPREHENSIVE METABOLIC PANEL
ALT: 17 IU/L (ref 0–32)
AST: 19 IU/L (ref 0–40)
Albumin/Globulin Ratio: 1.9 (ref 1.2–2.2)
Albumin: 4.7 g/dL (ref 3.9–4.9)
Alkaline Phosphatase: 92 IU/L (ref 44–121)
BUN/Creatinine Ratio: 28 — ABNORMAL HIGH (ref 9–23)
BUN: 22 mg/dL (ref 6–24)
Bilirubin Total: 0.3 mg/dL (ref 0.0–1.2)
CO2: 25 mmol/L (ref 20–29)
Calcium: 9.6 mg/dL (ref 8.7–10.2)
Chloride: 100 mmol/L (ref 96–106)
Creatinine, Ser: 0.79 mg/dL (ref 0.57–1.00)
Globulin, Total: 2.5 g/dL (ref 1.5–4.5)
Glucose: 85 mg/dL (ref 70–99)
Potassium: 4.2 mmol/L (ref 3.5–5.2)
Sodium: 140 mmol/L (ref 134–144)
Total Protein: 7.2 g/dL (ref 6.0–8.5)
eGFR: 91 mL/min/{1.73_m2} (ref >59)

## 2022-01-03 DIAGNOSIS — Z1231 Encounter for screening mammogram for malignant neoplasm of breast: Secondary | ICD-10-CM | POA: Diagnosis not present

## 2022-01-03 DIAGNOSIS — Z01419 Encounter for gynecological examination (general) (routine) without abnormal findings: Secondary | ICD-10-CM | POA: Diagnosis not present

## 2022-01-26 DIAGNOSIS — M7062 Trochanteric bursitis, left hip: Secondary | ICD-10-CM | POA: Diagnosis not present

## 2022-01-26 DIAGNOSIS — M1612 Unilateral primary osteoarthritis, left hip: Secondary | ICD-10-CM | POA: Diagnosis not present

## 2022-02-15 DIAGNOSIS — J329 Chronic sinusitis, unspecified: Secondary | ICD-10-CM | POA: Diagnosis not present

## 2022-02-15 DIAGNOSIS — J40 Bronchitis, not specified as acute or chronic: Secondary | ICD-10-CM | POA: Diagnosis not present

## 2022-02-21 DIAGNOSIS — Z1231 Encounter for screening mammogram for malignant neoplasm of breast: Secondary | ICD-10-CM | POA: Diagnosis not present

## 2022-04-02 DIAGNOSIS — Z713 Dietary counseling and surveillance: Secondary | ICD-10-CM | POA: Diagnosis not present

## 2022-04-10 DIAGNOSIS — L301 Dyshidrosis [pompholyx]: Secondary | ICD-10-CM | POA: Diagnosis not present

## 2022-04-10 DIAGNOSIS — R531 Weakness: Secondary | ICD-10-CM | POA: Diagnosis not present

## 2022-04-10 DIAGNOSIS — L719 Rosacea, unspecified: Secondary | ICD-10-CM | POA: Diagnosis not present

## 2022-04-17 DIAGNOSIS — F4323 Adjustment disorder with mixed anxiety and depressed mood: Secondary | ICD-10-CM | POA: Diagnosis not present

## 2022-04-18 DIAGNOSIS — Z713 Dietary counseling and surveillance: Secondary | ICD-10-CM | POA: Diagnosis not present

## 2022-04-23 DIAGNOSIS — S91109A Unspecified open wound of unspecified toe(s) without damage to nail, initial encounter: Secondary | ICD-10-CM | POA: Diagnosis not present

## 2022-04-23 DIAGNOSIS — Z23 Encounter for immunization: Secondary | ICD-10-CM | POA: Diagnosis not present

## 2022-04-23 DIAGNOSIS — Z6830 Body mass index (BMI) 30.0-30.9, adult: Secondary | ICD-10-CM | POA: Diagnosis not present

## 2022-04-25 DIAGNOSIS — L97521 Non-pressure chronic ulcer of other part of left foot limited to breakdown of skin: Secondary | ICD-10-CM | POA: Diagnosis not present

## 2022-04-25 DIAGNOSIS — L03032 Cellulitis of left toe: Secondary | ICD-10-CM | POA: Diagnosis not present

## 2022-05-31 DIAGNOSIS — M2042 Other hammer toe(s) (acquired), left foot: Secondary | ICD-10-CM | POA: Diagnosis not present

## 2022-05-31 DIAGNOSIS — S99922A Unspecified injury of left foot, initial encounter: Secondary | ICD-10-CM | POA: Diagnosis not present

## 2022-05-31 DIAGNOSIS — M79672 Pain in left foot: Secondary | ICD-10-CM | POA: Diagnosis not present

## 2022-05-31 DIAGNOSIS — L97521 Non-pressure chronic ulcer of other part of left foot limited to breakdown of skin: Secondary | ICD-10-CM | POA: Diagnosis not present

## 2022-05-31 DIAGNOSIS — M2022 Hallux rigidus, left foot: Secondary | ICD-10-CM | POA: Diagnosis not present

## 2022-06-05 DIAGNOSIS — F4323 Adjustment disorder with mixed anxiety and depressed mood: Secondary | ICD-10-CM | POA: Diagnosis not present

## 2022-06-21 DIAGNOSIS — B309 Viral conjunctivitis, unspecified: Secondary | ICD-10-CM | POA: Diagnosis not present

## 2022-06-21 DIAGNOSIS — I10 Essential (primary) hypertension: Secondary | ICD-10-CM | POA: Diagnosis not present

## 2022-06-21 DIAGNOSIS — Z6830 Body mass index (BMI) 30.0-30.9, adult: Secondary | ICD-10-CM | POA: Diagnosis not present

## 2022-06-21 DIAGNOSIS — F39 Unspecified mood [affective] disorder: Secondary | ICD-10-CM | POA: Diagnosis not present

## 2022-07-04 DIAGNOSIS — F4323 Adjustment disorder with mixed anxiety and depressed mood: Secondary | ICD-10-CM | POA: Diagnosis not present

## 2022-07-19 DIAGNOSIS — F4323 Adjustment disorder with mixed anxiety and depressed mood: Secondary | ICD-10-CM | POA: Diagnosis not present

## 2022-07-26 DIAGNOSIS — I10 Essential (primary) hypertension: Secondary | ICD-10-CM | POA: Diagnosis not present

## 2022-07-26 DIAGNOSIS — F419 Anxiety disorder, unspecified: Secondary | ICD-10-CM | POA: Diagnosis not present

## 2022-08-01 DIAGNOSIS — F4323 Adjustment disorder with mixed anxiety and depressed mood: Secondary | ICD-10-CM | POA: Diagnosis not present

## 2022-08-15 DIAGNOSIS — F4323 Adjustment disorder with mixed anxiety and depressed mood: Secondary | ICD-10-CM | POA: Diagnosis not present

## 2022-08-30 DIAGNOSIS — F4323 Adjustment disorder with mixed anxiety and depressed mood: Secondary | ICD-10-CM | POA: Diagnosis not present

## 2022-09-12 DIAGNOSIS — F4323 Adjustment disorder with mixed anxiety and depressed mood: Secondary | ICD-10-CM | POA: Diagnosis not present

## 2022-10-10 DIAGNOSIS — M7711 Lateral epicondylitis, right elbow: Secondary | ICD-10-CM | POA: Diagnosis not present

## 2022-10-29 DIAGNOSIS — S0502XA Injury of conjunctiva and corneal abrasion without foreign body, left eye, initial encounter: Secondary | ICD-10-CM | POA: Diagnosis not present

## 2022-10-30 DIAGNOSIS — F4323 Adjustment disorder with mixed anxiety and depressed mood: Secondary | ICD-10-CM | POA: Diagnosis not present

## 2022-11-21 DIAGNOSIS — M7711 Lateral epicondylitis, right elbow: Secondary | ICD-10-CM | POA: Diagnosis not present

## 2022-11-28 IMAGING — CT CT CARDIAC CORONARY ARTERY CALCIUM SCORE
3 series · 14 of 20 positions shown, 15 images · non-contrast
Comparison: None.
COMPARISON: None.

Addendum:
EXAM:
OVER-READ INTERPRETATION  CT CHEST

The following report is an over-read performed by radiologist Dr.
Giovambattista Grieco [REDACTED] on 09/23/2020. This
over-read does not include interpretation of cardiac or coronary
anatomy or pathology. The coronary calcium score interpretation by
the cardiologist is attached.
CLINICAL DATA: Risk stratification: 50 Year-old White Female
Coronary Calcium Score
TECHNIQUE: The patient was scanned on a Siemens Force scanner. Axial
non-contrast 3 mm slices were carried out through the heart. The
data set was analyzed on a dedicated work station and scored using
the Agatson method.

[Series 2: casc 3.0 bv41 2 bestdiast 71 % · axial · 0.33mm/px · z∈[+1272,+1350]mm · 4 of 44 slices shown, 5 images]
[im 9/44  vessel]
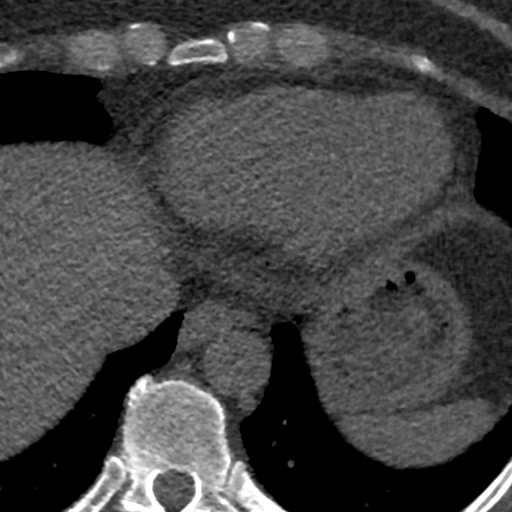
[im 9/44  lung]
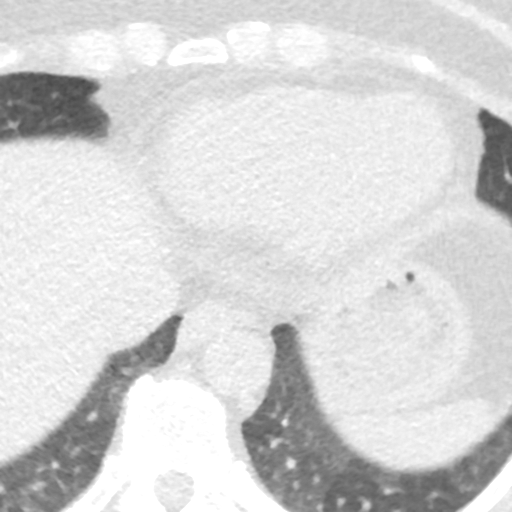
[im 18/44  vessel]
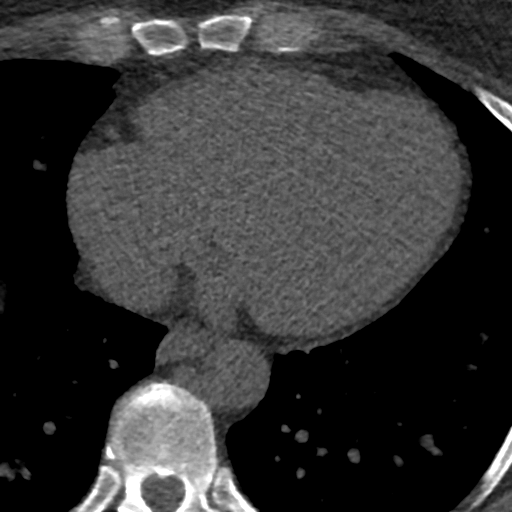
[im 26/44  vessel]
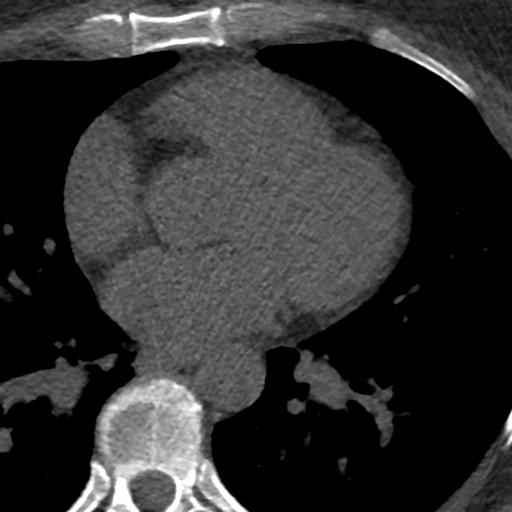
[im 35/44  vessel]
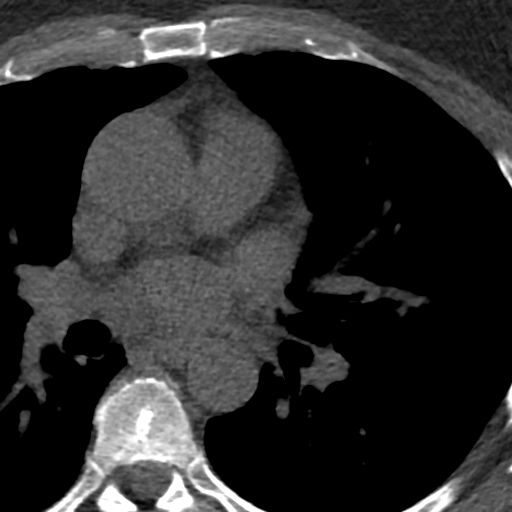

[Series 3: lung 71 % · axial · 0.68mm/px · z∈[+1268,+1352]mm · 5 of 44 slices shown]
[im 8/44  lung]
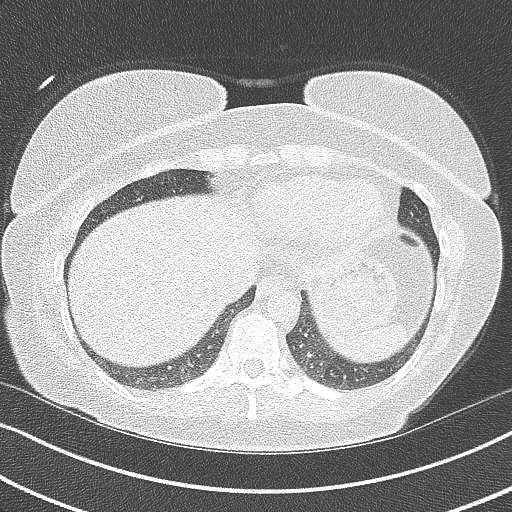
[im 15/44  lung]
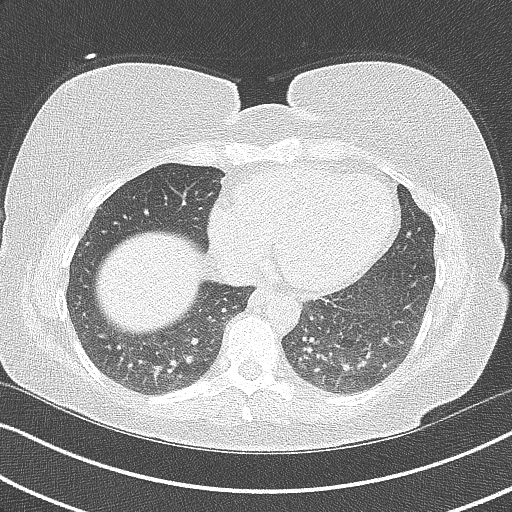
[im 22/44  lung]
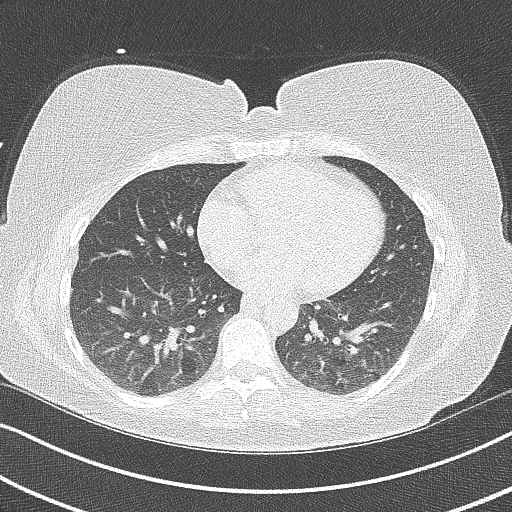
[im 29/44  lung]
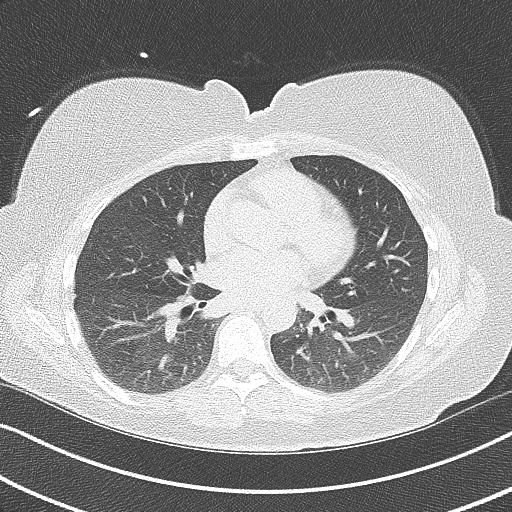
[im 36/44  lung]
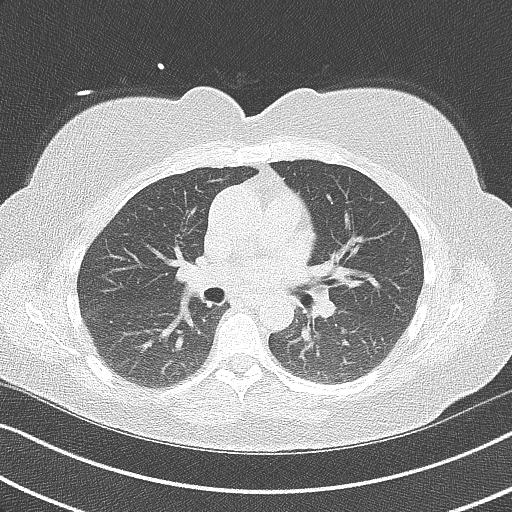

[Series 4: lung st 71 % · axial · 0.68mm/px · z∈[+1268,+1352]mm · 5 of 44 slices shown]
[im 8/44  lung]
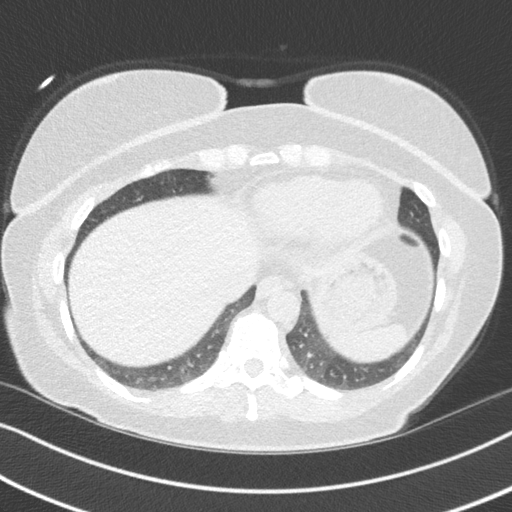
[im 15/44  lung]
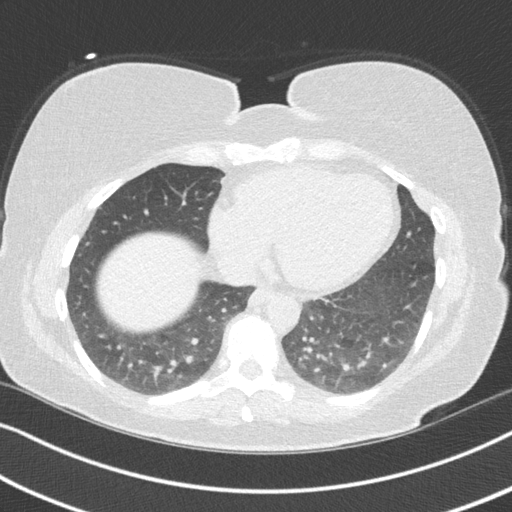
[im 22/44  lung]
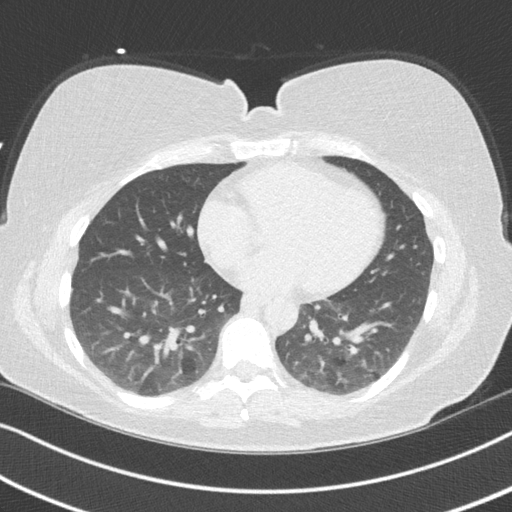
[im 29/44  lung]
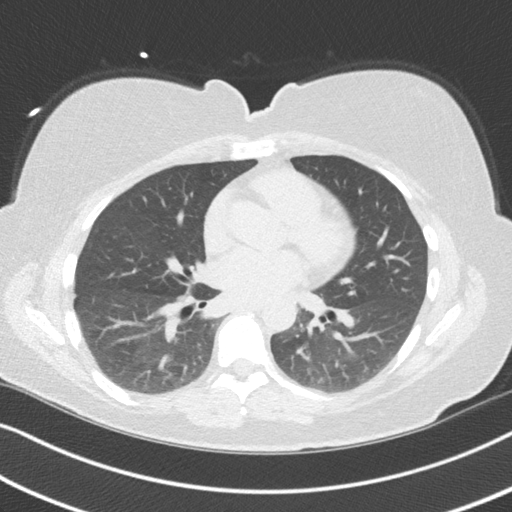
[im 36/44  lung]
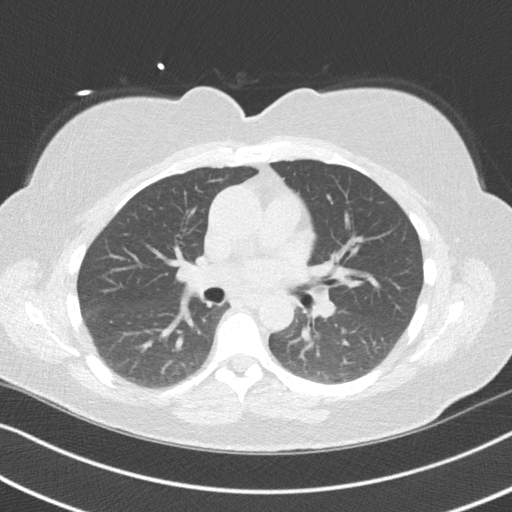

[14 of 20 positions shown; findings below may reference images not displayed]

FINDINGS: Within the visualized portions of the thorax there are no suspicious
appearing pulmonary nodules or masses, there is no acute
consolidative airspace disease, no pleural effusions, no
pneumothorax and no lymphadenopathy. Visualized portions of the
upper abdomen are unremarkable. There are no aggressive appearing
lytic or blastic lesions noted in the visualized portions of the
skeleton.
IMPRESSION: 1. No significant incidental noncardiac findings are noted.
FINDINGS: Non-cardiac: See separate report from [REDACTED].

Ascending Aorta: Evidence of ascending aortic dilation 42 mm on
non-contrasted study.

Pericardium: Normal.

Coronary arteries: Normal origins.

Coronary Calcium Score:

Left main: 0

Left anterior descending artery: 0

Left circumflex artery: 0

Right coronary artery: 0

Total: 0

Percentile: 1st for age, sex, and race matched control.
IMPRESSION: 1. Coronary calcium score of 0. This was 1st percentile for age,
gender, and race matched controls.

2. Evidence of ascending aortic dilation 42 mm on non-contrasted
study. Consider secondary imaging modality (echocardiogram, CTA
Aorta Protocol, MRA Aorta Protocol) if clinically indicated.

RECOMMENDATIONS:



If CAC = 0, it is reasonable to withhold statin therapy and reassess
in 5 to 10 years, as long as higher risk conditions are absent
(diabetes mellitus, family history of premature CHD in first degree
relatives (males <55 years; females <65 years), cigarette smoking,
LDL >=190 mg/dL or other independent risk factors).

If CAC is 1 to 99, it is reasonable to initiate statin therapy for
patients >=55 years of age.

If CAC is >=100 or >=75th percentile, it is reasonable to initiate
statin therapy at any age.

Cardiology referral should be considered for patients with CAC
scores =400 or >=75th percentile.

*9023 AHA/ACC/AACVPR/AAPA/ABC/LESLY/ANYORIGI/JUARBE/Faton/HALLAN/JULLY/COURET
Guideline on the Management of Blood Cholesterol: A Report of the
American College of Cardiology/American Heart Association Task Force
on Clinical Practice Guidelines. J Am Coll Cardiol.
2375;73(24):7451-7112.

*** End of Addendum ***
EXAM:
OVER-READ INTERPRETATION  CT CHEST

The following report is an over-read performed by radiologist Dr.
Giovambattista Grieco [REDACTED] on 09/23/2020. This
over-read does not include interpretation of cardiac or coronary
anatomy or pathology. The coronary calcium score interpretation by
the cardiologist is attached.
FINDINGS: Within the visualized portions of the thorax there are no suspicious
appearing pulmonary nodules or masses, there is no acute
consolidative airspace disease, no pleural effusions, no
pneumothorax and no lymphadenopathy. Visualized portions of the
upper abdomen are unremarkable. There are no aggressive appearing
lytic or blastic lesions noted in the visualized portions of the
skeleton.
IMPRESSION: 1. No significant incidental noncardiac findings are noted.

## 2022-12-19 DIAGNOSIS — F4323 Adjustment disorder with mixed anxiety and depressed mood: Secondary | ICD-10-CM | POA: Diagnosis not present

## 2023-01-02 DIAGNOSIS — F4323 Adjustment disorder with mixed anxiety and depressed mood: Secondary | ICD-10-CM | POA: Diagnosis not present

## 2023-01-08 DIAGNOSIS — Z01419 Encounter for gynecological examination (general) (routine) without abnormal findings: Secondary | ICD-10-CM | POA: Diagnosis not present

## 2023-01-08 DIAGNOSIS — Z13220 Encounter for screening for lipoid disorders: Secondary | ICD-10-CM | POA: Diagnosis not present

## 2023-01-08 DIAGNOSIS — F4323 Adjustment disorder with mixed anxiety and depressed mood: Secondary | ICD-10-CM | POA: Diagnosis not present

## 2023-01-08 DIAGNOSIS — Z113 Encounter for screening for infections with a predominantly sexual mode of transmission: Secondary | ICD-10-CM | POA: Diagnosis not present

## 2023-01-08 DIAGNOSIS — Z1231 Encounter for screening mammogram for malignant neoplasm of breast: Secondary | ICD-10-CM | POA: Diagnosis not present

## 2023-01-21 DIAGNOSIS — F4323 Adjustment disorder with mixed anxiety and depressed mood: Secondary | ICD-10-CM | POA: Diagnosis not present

## 2023-01-25 DIAGNOSIS — J339 Nasal polyp, unspecified: Secondary | ICD-10-CM | POA: Diagnosis not present

## 2023-01-25 DIAGNOSIS — J309 Allergic rhinitis, unspecified: Secondary | ICD-10-CM | POA: Diagnosis not present

## 2023-01-28 DIAGNOSIS — I10 Essential (primary) hypertension: Secondary | ICD-10-CM | POA: Diagnosis not present

## 2023-01-28 DIAGNOSIS — R7303 Prediabetes: Secondary | ICD-10-CM | POA: Diagnosis not present

## 2023-01-28 DIAGNOSIS — J339 Nasal polyp, unspecified: Secondary | ICD-10-CM | POA: Diagnosis not present

## 2023-01-28 DIAGNOSIS — F419 Anxiety disorder, unspecified: Secondary | ICD-10-CM | POA: Diagnosis not present

## 2023-01-30 DIAGNOSIS — F4323 Adjustment disorder with mixed anxiety and depressed mood: Secondary | ICD-10-CM | POA: Diagnosis not present

## 2023-02-04 DIAGNOSIS — F4323 Adjustment disorder with mixed anxiety and depressed mood: Secondary | ICD-10-CM | POA: Diagnosis not present

## 2023-02-18 DIAGNOSIS — F4323 Adjustment disorder with mixed anxiety and depressed mood: Secondary | ICD-10-CM | POA: Diagnosis not present

## 2023-02-25 DIAGNOSIS — Z1231 Encounter for screening mammogram for malignant neoplasm of breast: Secondary | ICD-10-CM | POA: Diagnosis not present

## 2023-03-01 DIAGNOSIS — F4310 Post-traumatic stress disorder, unspecified: Secondary | ICD-10-CM | POA: Diagnosis not present

## 2023-03-13 DIAGNOSIS — Z1231 Encounter for screening mammogram for malignant neoplasm of breast: Secondary | ICD-10-CM | POA: Diagnosis not present

## 2023-03-19 DIAGNOSIS — F4323 Adjustment disorder with mixed anxiety and depressed mood: Secondary | ICD-10-CM | POA: Diagnosis not present

## 2023-03-23 DIAGNOSIS — F4310 Post-traumatic stress disorder, unspecified: Secondary | ICD-10-CM | POA: Diagnosis not present

## 2023-03-24 DIAGNOSIS — F4310 Post-traumatic stress disorder, unspecified: Secondary | ICD-10-CM | POA: Diagnosis not present

## 2023-03-26 DIAGNOSIS — F4310 Post-traumatic stress disorder, unspecified: Secondary | ICD-10-CM | POA: Diagnosis not present

## 2023-03-27 DIAGNOSIS — F4323 Adjustment disorder with mixed anxiety and depressed mood: Secondary | ICD-10-CM | POA: Diagnosis not present

## 2023-03-29 DIAGNOSIS — F4310 Post-traumatic stress disorder, unspecified: Secondary | ICD-10-CM | POA: Diagnosis not present

## 2023-04-02 DIAGNOSIS — F4323 Adjustment disorder with mixed anxiety and depressed mood: Secondary | ICD-10-CM | POA: Diagnosis not present

## 2023-04-03 DIAGNOSIS — M25512 Pain in left shoulder: Secondary | ICD-10-CM | POA: Diagnosis not present

## 2023-04-05 DIAGNOSIS — F4310 Post-traumatic stress disorder, unspecified: Secondary | ICD-10-CM | POA: Diagnosis not present

## 2023-04-09 DIAGNOSIS — F4310 Post-traumatic stress disorder, unspecified: Secondary | ICD-10-CM | POA: Diagnosis not present

## 2023-04-16 DIAGNOSIS — F4323 Adjustment disorder with mixed anxiety and depressed mood: Secondary | ICD-10-CM | POA: Diagnosis not present

## 2023-04-24 DIAGNOSIS — F4323 Adjustment disorder with mixed anxiety and depressed mood: Secondary | ICD-10-CM | POA: Diagnosis not present

## 2023-04-26 DIAGNOSIS — F4310 Post-traumatic stress disorder, unspecified: Secondary | ICD-10-CM | POA: Diagnosis not present

## 2023-05-03 DIAGNOSIS — M40292 Other kyphosis, cervical region: Secondary | ICD-10-CM | POA: Diagnosis not present

## 2023-05-03 DIAGNOSIS — M542 Cervicalgia: Secondary | ICD-10-CM | POA: Diagnosis not present

## 2023-05-03 DIAGNOSIS — M25512 Pain in left shoulder: Secondary | ICD-10-CM | POA: Diagnosis not present

## 2023-05-09 DIAGNOSIS — F4323 Adjustment disorder with mixed anxiety and depressed mood: Secondary | ICD-10-CM | POA: Diagnosis not present

## 2023-05-14 DIAGNOSIS — F4323 Adjustment disorder with mixed anxiety and depressed mood: Secondary | ICD-10-CM | POA: Diagnosis not present

## 2023-05-26 ENCOUNTER — Other Ambulatory Visit (HOSPITAL_BASED_OUTPATIENT_CLINIC_OR_DEPARTMENT_OTHER): Payer: Self-pay | Admitting: Neurosurgery

## 2023-05-26 DIAGNOSIS — M40292 Other kyphosis, cervical region: Secondary | ICD-10-CM

## 2023-05-28 DIAGNOSIS — F4323 Adjustment disorder with mixed anxiety and depressed mood: Secondary | ICD-10-CM | POA: Diagnosis not present

## 2023-06-12 DIAGNOSIS — F4323 Adjustment disorder with mixed anxiety and depressed mood: Secondary | ICD-10-CM | POA: Diagnosis not present

## 2023-06-13 ENCOUNTER — Ambulatory Visit (HOSPITAL_BASED_OUTPATIENT_CLINIC_OR_DEPARTMENT_OTHER)
Admission: RE | Admit: 2023-06-13 | Discharge: 2023-06-13 | Disposition: A | Discharge status: Home / Self Care | Source: Ambulatory Visit | Attending: Neurosurgery | Admitting: Neurosurgery

## 2023-06-13 DIAGNOSIS — M542 Cervicalgia: Secondary | ICD-10-CM | POA: Diagnosis not present

## 2023-06-13 DIAGNOSIS — M419 Scoliosis, unspecified: Secondary | ICD-10-CM | POA: Diagnosis not present

## 2023-06-13 DIAGNOSIS — M50221 Other cervical disc displacement at C4-C5 level: Secondary | ICD-10-CM | POA: Diagnosis not present

## 2023-06-13 DIAGNOSIS — M5021 Other cervical disc displacement,  high cervical region: Secondary | ICD-10-CM | POA: Diagnosis not present

## 2023-06-13 DIAGNOSIS — M4802 Spinal stenosis, cervical region: Secondary | ICD-10-CM | POA: Diagnosis not present

## 2023-06-13 DIAGNOSIS — M40292 Other kyphosis, cervical region: Secondary | ICD-10-CM

## 2023-06-20 DIAGNOSIS — M542 Cervicalgia: Secondary | ICD-10-CM | POA: Diagnosis not present

## 2023-06-25 DIAGNOSIS — F4323 Adjustment disorder with mixed anxiety and depressed mood: Secondary | ICD-10-CM | POA: Diagnosis not present

## 2023-07-02 DIAGNOSIS — M25531 Pain in right wrist: Secondary | ICD-10-CM | POA: Diagnosis not present

## 2023-07-02 DIAGNOSIS — S6991XA Unspecified injury of right wrist, hand and finger(s), initial encounter: Secondary | ICD-10-CM | POA: Diagnosis not present

## 2023-07-02 DIAGNOSIS — S6391XA Sprain of unspecified part of right wrist and hand, initial encounter: Secondary | ICD-10-CM | POA: Diagnosis not present

## 2023-07-02 DIAGNOSIS — W2209XA Striking against other stationary object, initial encounter: Secondary | ICD-10-CM | POA: Diagnosis not present

## 2023-07-08 DIAGNOSIS — F4323 Adjustment disorder with mixed anxiety and depressed mood: Secondary | ICD-10-CM | POA: Diagnosis not present

## 2023-07-18 DIAGNOSIS — S6991XA Unspecified injury of right wrist, hand and finger(s), initial encounter: Secondary | ICD-10-CM | POA: Diagnosis not present

## 2023-07-24 DIAGNOSIS — M47812 Spondylosis without myelopathy or radiculopathy, cervical region: Secondary | ICD-10-CM | POA: Diagnosis not present

## 2023-07-24 DIAGNOSIS — G8929 Other chronic pain: Secondary | ICD-10-CM | POA: Diagnosis not present

## 2023-07-24 DIAGNOSIS — M792 Neuralgia and neuritis, unspecified: Secondary | ICD-10-CM | POA: Diagnosis not present

## 2023-07-29 DIAGNOSIS — F419 Anxiety disorder, unspecified: Secondary | ICD-10-CM | POA: Diagnosis not present

## 2023-07-29 DIAGNOSIS — I10 Essential (primary) hypertension: Secondary | ICD-10-CM | POA: Diagnosis not present

## 2023-07-29 DIAGNOSIS — R7303 Prediabetes: Secondary | ICD-10-CM | POA: Diagnosis not present

## 2023-07-31 DIAGNOSIS — F4323 Adjustment disorder with mixed anxiety and depressed mood: Secondary | ICD-10-CM | POA: Diagnosis not present

## 2023-08-05 DIAGNOSIS — X58XXXA Exposure to other specified factors, initial encounter: Secondary | ICD-10-CM | POA: Diagnosis not present

## 2023-08-05 DIAGNOSIS — T50902A Poisoning by unspecified drugs, medicaments and biological substances, intentional self-harm, initial encounter: Secondary | ICD-10-CM | POA: Diagnosis not present

## 2023-08-05 DIAGNOSIS — R45851 Suicidal ideations: Secondary | ICD-10-CM | POA: Diagnosis not present

## 2023-08-05 DIAGNOSIS — R531 Weakness: Secondary | ICD-10-CM | POA: Diagnosis not present

## 2023-08-05 DIAGNOSIS — R42 Dizziness and giddiness: Secondary | ICD-10-CM | POA: Diagnosis not present

## 2023-08-05 DIAGNOSIS — M1990 Unspecified osteoarthritis, unspecified site: Secondary | ICD-10-CM | POA: Diagnosis not present

## 2023-08-05 DIAGNOSIS — Z79899 Other long term (current) drug therapy: Secondary | ICD-10-CM | POA: Diagnosis not present

## 2023-08-05 DIAGNOSIS — T426X2A Poisoning by other antiepileptic and sedative-hypnotic drugs, intentional self-harm, initial encounter: Secondary | ICD-10-CM | POA: Diagnosis not present

## 2023-08-05 DIAGNOSIS — Z136 Encounter for screening for cardiovascular disorders: Secondary | ICD-10-CM | POA: Diagnosis not present

## 2023-08-05 DIAGNOSIS — F419 Anxiety disorder, unspecified: Secondary | ICD-10-CM | POA: Diagnosis not present

## 2023-08-05 DIAGNOSIS — I10 Essential (primary) hypertension: Secondary | ICD-10-CM | POA: Diagnosis not present

## 2023-08-06 ENCOUNTER — Encounter: Payer: Self-pay | Admitting: Psychiatry

## 2023-08-06 ENCOUNTER — Inpatient Hospital Stay
Admission: AD | Admit: 2023-08-06 | Discharge: 2023-08-09 | DRG: 885 | Disposition: A | Discharge status: Home / Self Care | Source: Other Acute Inpatient Hospital | Attending: Psychiatry | Admitting: Psychiatry

## 2023-08-06 ENCOUNTER — Other Ambulatory Visit: Payer: Self-pay

## 2023-08-06 DIAGNOSIS — Z888 Allergy status to other drugs, medicaments and biological substances status: Secondary | ICD-10-CM

## 2023-08-06 DIAGNOSIS — Z83438 Family history of other disorder of lipoprotein metabolism and other lipidemia: Secondary | ICD-10-CM

## 2023-08-06 DIAGNOSIS — F411 Generalized anxiety disorder: Secondary | ICD-10-CM | POA: Diagnosis present

## 2023-08-06 DIAGNOSIS — F419 Anxiety disorder, unspecified: Secondary | ICD-10-CM | POA: Diagnosis not present

## 2023-08-06 DIAGNOSIS — Z8249 Family history of ischemic heart disease and other diseases of the circulatory system: Secondary | ICD-10-CM

## 2023-08-06 DIAGNOSIS — M1990 Unspecified osteoarthritis, unspecified site: Secondary | ICD-10-CM | POA: Diagnosis not present

## 2023-08-06 DIAGNOSIS — Z981 Arthrodesis status: Secondary | ICD-10-CM | POA: Diagnosis not present

## 2023-08-06 DIAGNOSIS — F322 Major depressive disorder, single episode, severe without psychotic features: Secondary | ICD-10-CM | POA: Diagnosis not present

## 2023-08-06 DIAGNOSIS — F4010 Social phobia, unspecified: Secondary | ICD-10-CM | POA: Diagnosis present

## 2023-08-06 DIAGNOSIS — I10 Essential (primary) hypertension: Secondary | ICD-10-CM | POA: Diagnosis present

## 2023-08-06 DIAGNOSIS — X58XXXA Exposure to other specified factors, initial encounter: Secondary | ICD-10-CM | POA: Diagnosis not present

## 2023-08-06 DIAGNOSIS — Z8349 Family history of other endocrine, nutritional and metabolic diseases: Secondary | ICD-10-CM | POA: Diagnosis not present

## 2023-08-06 DIAGNOSIS — Z79899 Other long term (current) drug therapy: Secondary | ICD-10-CM | POA: Diagnosis not present

## 2023-08-06 DIAGNOSIS — T426X2A Poisoning by other antiepileptic and sedative-hypnotic drugs, intentional self-harm, initial encounter: Secondary | ICD-10-CM | POA: Diagnosis not present

## 2023-08-06 DIAGNOSIS — T50902A Poisoning by unspecified drugs, medicaments and biological substances, intentional self-harm, initial encounter: Secondary | ICD-10-CM | POA: Diagnosis not present

## 2023-08-06 MED ORDER — HALOPERIDOL 5 MG PO TABS: 5.0000 mg | ORAL_TABLET | Freq: Three times a day (TID) | ORAL | Status: DC | PRN

## 2023-08-06 MED ORDER — LAMOTRIGINE ER 100 MG PO TB24
200.0000 mg | ORAL_TABLET | Freq: Every day | ORAL | Status: DC
Start: 2023-08-07 — End: 2023-08-09
  Administered 2023-08-07 – 2023-08-09 (×3): 200 mg via ORAL
  Filled 2023-08-06 (×3): qty 2

## 2023-08-06 MED ORDER — ACETAMINOPHEN 325 MG PO TABS: 650.0000 mg | ORAL_TABLET | Freq: Four times a day (QID) | ORAL | Status: DC | PRN

## 2023-08-06 MED ORDER — MULTIVITAMINS PO CAPS
1.0000 | ORAL_CAPSULE | Freq: Every day | ORAL | Status: DC
Start: 2023-08-07 — End: 2023-08-06

## 2023-08-06 MED ORDER — DIPHENHYDRAMINE HCL 50 MG/ML IJ SOLN: 50.0000 mg | Freq: Three times a day (TID) | INTRAMUSCULAR | Status: DC | PRN

## 2023-08-06 MED ORDER — LORAZEPAM 2 MG/ML IJ SOLN: 2.0000 mg | Freq: Three times a day (TID) | INTRAMUSCULAR | Status: DC | PRN

## 2023-08-06 MED ORDER — MELATONIN 5 MG PO TABS: 5.0000 mg | ORAL_TABLET | Freq: Every evening | ORAL | Status: DC | PRN

## 2023-08-06 MED ORDER — SPIRONOLACTONE 25 MG PO TABS
25.0000 mg | ORAL_TABLET | Freq: Every day | ORAL | Status: DC
  Administered 2023-08-07 – 2023-08-09 (×3): 25 mg via ORAL
  Filled 2023-08-06 (×3): qty 1

## 2023-08-06 MED ORDER — MAGNESIUM HYDROXIDE 400 MG/5ML PO SUSP
30.0000 mL | Freq: Every day | ORAL | Status: DC | PRN
  Administered 2023-08-07: 30 mL via ORAL
  Filled 2023-08-06 (×2): qty 30

## 2023-08-06 MED ORDER — BUSPIRONE HCL 5 MG PO TABS
30.0000 mg | ORAL_TABLET | Freq: Two times a day (BID) | ORAL | Status: DC
  Administered 2023-08-06 – 2023-08-09 (×6): 30 mg via ORAL
  Filled 2023-08-06 (×6): qty 6

## 2023-08-06 MED ORDER — DIPHENHYDRAMINE HCL 25 MG PO CAPS: 50.0000 mg | ORAL_CAPSULE | Freq: Three times a day (TID) | ORAL | Status: DC | PRN

## 2023-08-06 MED ORDER — AMLODIPINE BESYLATE 5 MG PO TABS
5.0000 mg | ORAL_TABLET | Freq: Every day | ORAL | Status: DC
  Filled 2023-08-06: qty 1

## 2023-08-06 MED ORDER — TRAZODONE HCL 50 MG PO TABS
50.0000 mg | ORAL_TABLET | Freq: Every evening | ORAL | Status: DC | PRN
  Administered 2023-08-06 – 2023-08-08 (×3): 50 mg via ORAL
  Filled 2023-08-06 (×3): qty 1

## 2023-08-06 MED ORDER — HALOPERIDOL LACTATE 5 MG/ML IJ SOLN: 10.0000 mg | Freq: Three times a day (TID) | INTRAMUSCULAR | Status: DC | PRN

## 2023-08-06 MED ORDER — METRONIDAZOLE 0.75 % EX GEL
Freq: Every day | CUTANEOUS | Status: DC
  Filled 2023-08-06 (×2): qty 45

## 2023-08-06 MED ORDER — PROSIGHT PO TABS
1.0000 | ORAL_TABLET | Freq: Every day | ORAL | Status: DC
  Administered 2023-08-07 – 2023-08-09 (×3): 1 via ORAL
  Filled 2023-08-06 (×3): qty 1

## 2023-08-06 MED ORDER — LAMOTRIGINE 100 MG PO TABS: 100.0000 mg | ORAL_TABLET | Freq: Two times a day (BID) | ORAL | Status: DC

## 2023-08-06 MED ORDER — HALOPERIDOL LACTATE 5 MG/ML IJ SOLN: 5.0000 mg | Freq: Three times a day (TID) | INTRAMUSCULAR | Status: DC | PRN

## 2023-08-06 MED ORDER — CLONIDINE HCL 0.1 MG PO TABS
0.1000 mg | ORAL_TABLET | Freq: Every day | ORAL | Status: DC
  Administered 2023-08-06 – 2023-08-08 (×3): 0.1 mg via ORAL
  Filled 2023-08-06 (×3): qty 1

## 2023-08-06 MED ORDER — ALUM & MAG HYDROXIDE-SIMETH 200-200-20 MG/5ML PO SUSP: 30.0000 mL | ORAL | Status: DC | PRN

## 2023-08-06 NOTE — Group Note (Signed)
 Date:  08/06/2023 Time:  9:03 PM  Group Topic/Focus:  Wrap-Up Group:   The focus of this group is to help patients review their daily goal of treatment and discuss progress on daily workbooks.    Participation Level:  Active  Participation Quality:  Appropriate and Attentive  Affect:  Appropriate  Cognitive:  Alert  Insight: Appropriate  Engagement in Group:  Engaged  Modes of Intervention:  Discussion  Additional Comments:     [name],Taylor Vanorman E 08/06/2023, 9:03 PM

## 2023-08-06 NOTE — Tx Team (Signed)
 Initial Treatment Plan 08/06/2023 8:08 PM KAISLEY STIVERSON GEX:[id number]    PATIENT STRESSORS: Traumatic event     PATIENT STRENGTHS: Ability for insight  Active sense of humor  Average or above average intelligence  Capable of independent living  Physical Health    PATIENT IDENTIFIED PROBLEMS: SI                     DISCHARGE CRITERIA:  Ability to meet basic life and health needs  PRELIMINARY DISCHARGE PLAN: Attend aftercare/continuing care group  PATIENT/FAMILY INVOLVEMENT: This treatment plan has been presented to and reviewed with the patient, Epimenio Hasten. The patient and family have been given the opportunity to ask questions and make suggestions.  Jeanmarie Millet, RN 08/06/2023, 8:08 PM

## 2023-08-06 NOTE — Progress Notes (Signed)
 Pt admitted from [facility] ED, report received from Rice Chamorro, [place]. Pt calm and pleasant during assessment. Pt currently denies SI/HI/AVH. Pt oriented to the unit and her room. Skin assessment completed on day shift, please check Flowsheets. Pt oriented to the unit and her room. Pt given education, support, and encouragement to be active in her treatment plan. Pt being monitored Q 15 minutes for safety per unit protocol, remains safe on the unit

## 2023-08-06 NOTE — Plan of Care (Signed)
 New Admitting  Problem: Education: Goal: Knowledge of [facility] Education information/materials will improve Outcome: Not Progressing Goal: Emotional status will improve Outcome: Not Progressing Goal: Mental status will improve Outcome: Not Progressing Goal: Verbalization of understanding the information provided will improve Outcome: Not Progressing   Problem: Activity: Goal: Interest or engagement in activities will improve Outcome: Not Progressing

## 2023-08-07 DIAGNOSIS — F322 Major depressive disorder, single episode, severe without psychotic features: Secondary | ICD-10-CM | POA: Diagnosis not present

## 2023-08-07 MED ORDER — LORATADINE 10 MG PO TABS: 10.0000 mg | ORAL_TABLET | Freq: Every day | ORAL | Status: DC | PRN

## 2023-08-07 MED ORDER — MIRTAZAPINE 15 MG PO TABS
15.0000 mg | ORAL_TABLET | Freq: Every day | ORAL | Status: DC
  Administered 2023-08-07 – 2023-08-08 (×2): 15 mg via ORAL
  Filled 2023-08-07 (×2): qty 1

## 2023-08-07 MED ORDER — GABAPENTIN 300 MG PO CAPS
300.0000 mg | ORAL_CAPSULE | Freq: Three times a day (TID) | ORAL | Status: DC
  Administered 2023-08-07 – 2023-08-09 (×6): 300 mg via ORAL
  Filled 2023-08-07 (×6): qty 1

## 2023-08-07 MED ORDER — FLUTICASONE PROPIONATE 50 MCG/ACT NA SUSP
1.0000 | Freq: Every day | NASAL | Status: DC
  Administered 2023-08-08 – 2023-08-09 (×2): 1 via NASAL
  Filled 2023-08-07: qty 16

## 2023-08-07 NOTE — Progress Notes (Signed)
 Pt calm and pleasant during assessment denying SI/HI/AVH. Pt stated she had a good day and singed her 73 hour release form today. Pt observed by this Clinical research associate interacting appropriately with staff and peers on the unit. Pt compliant with medication administration per MD orders. Pt given education, support, and encouragement to be active in her treatment plan. Pt being monitored Q 15 minutes for safety per unit protocol, remains safe on the unit

## 2023-08-07 NOTE — Group Note (Signed)
 Date:  08/07/2023 Time:  5:34 PM  Group Topic/Focus:  Wellness Toolbox:   The focus of this group is to discuss various aspects of wellness, balancing those aspects and exploring ways to increase the ability to experience wellness.  Patients will create a wellness toolbox for use upon discharge.    Participation Level:  Active  Participation Quality:  Appropriate  Affect:  Appropriate  Cognitive:  Appropriate  Insight: Appropriate  Engagement in Group:  Engaged  Modes of Intervention:  Activity  Additional Comments:    Laverne Potter 08/07/2023, 5:34 PM

## 2023-08-07 NOTE — [facility] Counselor (Signed)
 Adult Comprehensive Assessment  Patient ID: LAPORCHE MARTELLE, female   DOB: 1970/03/31, 53 y.o.   MRN: [id number]  Information Source: Information source: Patient  Current Stressors:  Patient states their primary concerns and needs for treatment are:: I didn't want to die but wanted respite or escape from my mind.  I was sad the kids didn't come to Father's Day.  I had drunk a couple of little moonshine bottles and took 4 Ambien.  I started to panic and I didn't want them to find my body so I wrote notes to my children. Patient states their goals for this hospitilization and ongoing recovery are:: get in with a psychiatrist and hopefully a psychologist Educational / Learning stressors: Pt denies. Employment / Job issues: Pt denies. Family Relationships: I don't know how to parent adult children. Financial / Lack of resources (include bankruptcy): Pt denies. Housing / Lack of housing: Pt denies. Physical health (include injuries & life threatening diseases): high blood pressure, sleep issues, history of spinal fusions Social relationships: Pt denies. Substance abuse: Pt denies. Bereavement / Loss: Pt denies.  Living/Environment/Situation:  Living Arrangements: Spouse/significant other Living conditions (as described by patient or guardian): WNL How long has patient lived in current situation?: 2018 What is atmosphere in current home: Comfortable, Loving, Supportive  Family History:  Marital status: Married Number of Years Married: 28 What types of issues is patient dealing with in the relationship?: Pt reports recent infidelity in marriage from husband, however, she and husband have reconciled and renewed vows.  We're probably at the best point we've ever been Does patient have children?: Yes How many children?: 2 (Adult children, 22 and 25) How is patient's relationship with their children?: Healthy.  I talk to them.  They talk to me.  Childhood History:  By whom was/is  the patient raised?: Both parents Description of patient's relationship with caregiver when they were a child: I knew I was loved.  Things were tight financially.  My dad had a horrible temper but never any physical abuse.  :ot of family drama.  I was very anxious. Patient's description of current relationship with people who raised him/her: Pt reports that her father is deceased.  Reports relationship with mother is codependent, she wants someone to care for her How were you disciplined when you got in trouble as a child/adolescent?: spanking, yelling Does patient have siblings?: Yes Number of Siblings: 1 Description of patient's current relationship with siblings: we get along fine but we have no expectations of him, he's not dependable Did patient suffer any verbal/emotional/physical/sexual abuse as a child?: No (Pt reports lots of yelling) Did patient suffer from severe childhood neglect?: No Has patient ever been sexually abused/assaulted/raped as an adolescent or adult?: No Was the patient ever a victim of a crime or a disaster?: No Witnessed domestic violence?: No Has patient been affected by domestic violence as an adult?: No  Education:  Highest grade of 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?: No  Financial  Resources:   Financial resources: Income from employment, Private insurance Does patient have a representative payee or guardian?: No  (Pt reports that she and her husband are the legal guardians of her sister in law.)  Alcohol/Substance Abuse:   What has been your use of drugs/alcohol within the last 12 months?: Pt reports that she drinks alcohol, however, declines that this is a concern. If attempted suicide, did drugs/alcohol play a role in this?: No Alcohol/Substance Abuse Treatment Hx: Denies past history Has alcohol/substance abuse ever caused legal problems?: No  Social Support System:   Patient's Community Support System: Good Describe Community Support System: husband, children, great group of friends, my mom wants to be, my church Type of faith/religion: Lynder Sanger How does patient's faith help to cope with current illness?: read devotions, listen to praise music, seek Bible for comfort  Leisure/Recreation:   Do You Have Hobbies?: Yes Leisure and Hobbies: love being outside, love to garden, Chartered loss adjuster, love to read, MahJong  Strengths/Needs:   What is the patient's perception of their strengths?: I am compassionate.  Very organized.  Very driven.  Good sense of humor. Patient states they can use these personal strengths during their treatment to contribute to their recovery: Pt denies. Patient states these barriers may affect/interfere with their treatment: Pt denies. Patient states these barriers may affect their return to the community: Pt denies.  Discharge Plan:   Currently receiving community mental health services: Yes (From Whom) [facility], [facility]) Patient states concerns and preferences for aftercare planning are: Pt reports that she would like to continue with her current provider and would like a psychiatrist. Patient states they will know when they are safe and ready for discharge when: I feel.  By the way that I feel.  If I am laughing.  If I'm confident. Does patient have access to transportation?: Yes Does patient have financial barriers related to discharge medications?:  No Will patient be returning to same living situation after discharge?: Yes  Summary/Recommendations:   Summary and Recommendations (to be completed by the evaluator): Patient is a 53 year old [place] [place] [facility]  Patient presents to the hospital following an alleged suicide attempt.  Patient reports to this writer that this was not an intentional attempt to die.  She reports that she came home and felt an intense feeling of sadness. She reports that she then drank a couple of little bottles of moonshine and then took four of her husbands Ambien.  She reports that after taking the Ambien she then began to panic and wrote notes for her family members, concerned that they may find her body.  She reports that she did not intend to harm herself but to take "respite" or "escape".  She reports that her current mental health symptoms were triggered by some conflict with her children. She reports that recently it was discovered that her husband of 28 years had been unfaithful. She reports that she has since forgiven her husband; they have reconciled, renewed their vows and are "probably at the best place we have been in our marriage".  She reports that while she has reconciled with her husband, their children are still struggling with the action. She reports that she was upset that their two children did not come home to spend Father's Day with them.  She reports that she is also triggered by having to adjust to parenting adults.  She reports that she currently sees a Librarian, academic and would like to continue with them.  She also reports that she would like a referral to see a psychiatrist as well.  Recommendations include crisis stabilization, therapeutic milieu, encourage group attendance and participation, medication management for detox/mood stabilization and development of comprehensive mental wellness/sobriety plan.  Larri Ply. 08/07/2023

## 2023-08-07 NOTE — H&P (Signed)
 Psychiatric Admission Assessment Adult  Patient Identification: Taylor [name] MRN:  [id number] Date of Evaluation:  08/07/2023 Chief Complaint:  MDD (major depressive disorder), severe (HCC) [F32.2]   History of Present Illness: Previous dx of anxiety, depression, and postpartum depression. Has had previous trials of celexa, wellbutrin, buspar, lamictal, prozac(she is uncertain), and cymbalta. Notes best response to Cymbalta. Has been on Lamictal since 2023 and Buspar since 2022, prior to that had been on no medications since 2012. Previously saw Dr. Clarnce Crow and is seeing Sharry Deem at [facility] physicians for medication management. Notes they have never seen a psychiatrist. Followed with psychology and is now seeing christian based counseling. Notes has struggled with anxiety both generalized and social anxiety her entire life. Notes that her relationship with children is strained following her husband having an affair notes that she has reconciled with her husband they went to counseling and also engaged in EMDR, feels she has coped well with this but children have been less forgiving. On Sunday she notes her children did not come and visit their father, and she felt hurt by this because her husband has worked so hard. States felt like she had a good day was out with friends and went to church. States she woke up Monday morning and had coffee with her dogs then she drove to her main residence to get some clothes for work. The house was empty and she was sad that the kids had not come yesterday she started drinking mini moonshine bottles and noted that she had several then states she wanted to feel numb, she wanted some respite from her anxiety and the hurt of things changing with children. Also notes stressor of mom recently developed dementia and she has difficulty sleeping. Notes that menaupose has disrupted sleep. Sleeps everynight but wakes frequently. She will sometimes utilize her husbands ambien  usually 4 times a month and notes that even with that she has nightime wakenings. At this point she took 72 ambien indicates that there was a full bottle and she only took 4 after taking the ambien she looked up and found that this was not a fatal dose. She then panicked and started writing letters to her family incase something happened when questioned about why she wrote the letters to the family she notes that she was worried if something happened. Then discussed why not ask for help call 911 or the husband in that situation she reports that she was too proud to ask for help and felt that if something did happened she did not want her family to find her. They have already signed the 72 hour form. They deny current si/hi/avh. PDMP is reviewed. Gabapentin was not reordered on admission. Discussed addition of remeron and pt was agreeable.   Total Time spent with patient: 1.5 hours Sleep  Sleep:No data recorded Past Psychiatric History:   Psychiatric History:  Information collected from patient and chart review  Prev Dx/Sx: Anxiety and depression Current Psych Provider: seeing pcp Home Meds (current): buspar, lamictal, gabapentin  Previous Med Trials: see above Therapy: currently enrolled in christian based therapy with provider discussed in hpi.   Prior Psych Hospitalization: none  Prior Self Harm: none Prior Violence: none  Family Psych History: none Family Hx suicide: none  Social History:  Developmental Hx: none Educational Hx: bachelors Occupational Hx: Nutritional therapist Hx: none Living Situation: w/husband Spiritual Hx: methodist Access to weapons/lethal means: none reported   Substance History Alcohol: social drinker  Tobacco: denies Illicit drugs: none  Prescription drug abuse: uses husbands ambien several times a month for sleep  Rehab hx: n/a Is the patient at risk to self? No.  Has the patient been a risk to self in the past 6 months? Yes.    Has the patient been a risk to  self within the distant past? No.  Is the patient a risk to others? No.  Has the patient been a risk to others in the past 6 months? No.  Has the patient been a risk to others within the distant past? No.   [place] Scale:  Flowsheet Row Admission (Current) from 08/06/2023 in [facility] INPATIENT BEHAVIORAL MEDICINE  C-SSRS RISK CATEGORY Low Risk     Past Medical History:  Past Medical History:  Diagnosis Date   Anxiety    Benign essential HTN    Chest discomfort 07/27/2020   Gestational diabetes    Obesity    Preeclampsia     Past Surgical History:  Procedure Laterality Date   CESAREAN SECTION  1999 / 2002   X2   ENDOMETRIAL ABLATION  2008   NASAL SEPTUM SURGERY     X2   SPINAL FUSION  2005   L4-5  X2   Family History:  Family History  Problem Relation Age of Onset   Hypercholesterolemia Mother    Thyroid  disease Mother    Heart attack Father    Hypertension Father    Hypercholesterolemia Father    Hypertension Brother    Heart attack Other    Heart disease Other    Hypertension Other     Social History:  Social History   Substance and Sexual Activity  Alcohol Use No   Comment: Rare     Social History   Substance and Sexual Activity  Drug Use No      Allergies:   Allergies  Allergen Reactions   Celexa [Citalopram] Other (See Comments)    ineffective   Cymbalta [Duloxetine Hcl] Other (See Comments)    Intolerant   Hydrochlorothiazide Other (See Comments)    Hypokalemia   Lab Results: No results found for this or any previous visit (from the past 48 hours).  Blood Alcohol level:  No results found for: [facility]  Metabolic Disorder Labs:  No results found for: HGBA1C, MPG No results found for: PROLACTIN Lab Results  Component Value Date   CHOL 189 12/13/2021   TRIG 57 12/13/2021   HDL 52 12/13/2021   CHOLHDL 3.6 12/13/2021   LDLCALC 126 (H) 12/13/2021    Current Medications: Current Facility-Administered Medications  Medication Dose Route  Frequency Provider Last Rate Last Admin   acetaminophen (TYLENOL) tablet 650 mg  650 mg Oral Q6H PRN Coleman, Carolyn H, NP       alum & mag hydroxide-simeth (MAALOX/MYLANTA) 200-200-20 MG/5ML suspension 30 mL  30 mL Oral Q4H PRN Coleman, Carolyn H, NP       amLODipine (NORVASC) tablet 5 mg  5 mg Oral Daily Costella Dirks, NP       busPIRone (BUSPAR) tablet 30 mg  30 mg Oral BID Coleman, Carolyn H, NP   30 mg at 08/07/23 0759   cloNIDine (CATAPRES) tablet 0.1 mg  0.1 mg Oral QHS Coleman, Carolyn H, NP   0.1 mg at 08/06/23 2107   haloperidol (HALDOL) tablet 5 mg  5 mg Oral TID PRN Costella Dirks, NP       And   diphenhydrAMINE (BENADRYL) capsule 50 mg  50 mg Oral TID PRN Costella Dirks, NP  haloperidol lactate (HALDOL) injection 5 mg  5 mg Intramuscular TID PRN Coleman, Carolyn H, NP       And   diphenhydrAMINE (BENADRYL) injection 50 mg  50 mg Intramuscular TID PRN Coleman, Carolyn H, NP       And   LORazepam (ATIVAN) injection 2 mg  2 mg Intramuscular TID PRN Costella Dirks, NP       haloperidol lactate (HALDOL) injection 10 mg  10 mg Intramuscular TID PRN Costella Dirks, NP       And   diphenhydrAMINE (BENADRYL) injection 50 mg  50 mg Intramuscular TID PRN Coleman, Carolyn H, NP       And   LORazepam (ATIVAN) injection 2 mg  2 mg Intramuscular TID PRN Costella Dirks, NP       lamoTRIgine (LAMICTAL XR) 24 hour tablet 200 mg  200 mg Oral Daily Coleman, Carolyn H, NP   200 mg at 08/07/23 0800   magnesium hydroxide (MILK OF MAGNESIA) suspension 30 mL  30 mL Oral Daily PRN Coleman, Carolyn H, NP       melatonin tablet 5 mg  5 mg Oral QHS PRN Coleman, Carolyn H, NP       metroNIDAZOLE (METROGEL) 0.75 % gel   Topical QHS Costella Dirks, NP       multivitamin (PROSIGHT) tablet 1 tablet  1 tablet Oral Q1200 Madelynn Schilder, RPH   1 tablet at 08/07/23 1242   spironolactone (ALDACTONE) tablet 25 mg  25 mg Oral Daily Coleman, Carolyn H, NP   25 mg at 08/07/23 0800    traZODone (DESYREL) tablet 50 mg  50 mg Oral QHS PRN Coleman, Carolyn H, NP   50 mg at 08/06/23 2107   PTA Medications: Medications Prior to Admission  Medication Sig Dispense Refill Last Dose/Taking   ALPRAZolam (XANAX) 0.25 MG tablet Take 0.25 mg by mouth at bedtime as needed for anxiety.      amLODipine (NORVASC) 5 MG tablet Take 5 mg by mouth daily.      Ashwagandha 500 MG CAPS Take 1 capsule by mouth daily.      busPIRone (BUSPAR) 15 MG tablet Take 15 mg by mouth 2 (two) times daily.      fexofenadine (ALLEGRA) 180 MG tablet Take 180 mg by mouth daily.      fluticasone (FLONASE) 50 MCG/ACT nasal spray Place 2 sprays into both nostrils daily.      lamoTRIgine (LAMICTAL) 100 MG tablet Take 100 mg by mouth daily.      Melatonin 1 MG CAPS Take 1 capsule by mouth at bedtime as needed (sleep).      Multiple Vitamin (MULTIVITAMIN) capsule Take 1 capsule by mouth daily.      spironolactone (ALDACTONE) 25 MG tablet Take 25 mg by mouth daily.       Psychiatric Specialty Exam:  Presentation  General Appearance: No data recorded Eye Contact:No data recorded Speech:No data recorded Speech Volume:No data recorded   Mood and Affect  Mood:No data recorded Affect:No data recorded  Thought Process  Thought Processes:No data recorded Descriptions of Associations:No data recorded Orientation:No data recorded Thought Content:No data recorded Hallucinations:No data recorded Ideas of Reference:No data recorded Suicidal Thoughts:No data recorded Homicidal Thoughts:No data recorded  Sensorium  Memory:No data recorded Judgment:No data recorded Insight:No data recorded  Executive Functions  Concentration:No data recorded Attention Span:No data recorded Recall:No data recorded Fund of Knowledge:No data recorded Language:No data recorded  Psychomotor Activity  Psychomotor Activity:No data recorded  Assets  Assets:No data recorded   Musculoskeletal: Strength & Muscle Tone:  within normal limits Gait & Station: normal  Physical Exam: Physical Exam Vitals and nursing note reviewed.  HENT:     Head: Atraumatic.   Eyes:     Extraocular Movements: Extraocular movements intact.   Pulmonary:     Effort: Pulmonary effort is normal.   Neurological:     Mental Status: She is alert and oriented to person, place, and time.    Review of Systems  Psychiatric/Behavioral:  Negative for depression, hallucinations, substance abuse and suicidal ideas. The patient is nervous/anxious and has insomnia.    Blood pressure 136/86, pulse 80, temperature 97.7 F (36.5 C), resp. rate 19, height 5' 6 (1.676 m), weight 73.5 kg, SpO2 98%. Body mass index is 26.15 kg/m.  Principal Diagnosis: MDD (major depressive disorder), severe (HCC) Diagnosis:  Principal Problem:   MDD (major depressive disorder), severe (HCC) Active Problems:   Anxiety   Clinical Decision Making:  Treatment Plan Summary:  Safety and Monitoring:             -- Voluntary admission to inpatient psychiatric unit for safety, stabilization and treatment             -- Daily contact with patient to assess and evaluate symptoms and progress in treatment             -- Patient's case to be discussed in multi-disciplinary team meeting             -- Observation Level: q15 minute checks             -- Vital signs:  q12 hours             -- Precautions: suicide, elopement, and assault   2. Psychiatric Diagnoses and Treatment:              - MDD: Continue lamictal 200 mg daily  - GAD continue buspar 30 mg BID  - start remeron 15 mg nightly   - Will get collateral from husband  - 72 hour form signed   -- The risks/benefits/side-effects/alternatives to this medication were discussed in detail with the patient and time was given for questions. The patient consents to medication trial.                -- Metabolic profile and EKG monitoring obtained while on an atypical antipsychotic (BMI: Lipid Panel:  HbgA1c: QTc:)              -- Encouraged patient to participate in unit milieu and in scheduled group therapies                            3. Medical Issues Being Addressed:   Stop norvasc pt no longer takes. - continue clonidine, gabapentinmetrogel, aldactone  htn, neuropathy 4. Discharge Planning:              -- Social work and case management to assist with discharge planning and identification of hospital follow-up needs prior to discharge             -- Estimated LOS: 5-7 days             -- Discharge Concerns: Need to establish a safety plan; Medication compliance and effectiveness             -- Discharge Goals: Return home with outpatient referrals follow ups  Physician Treatment Plan for Primary Diagnosis:  MDD (major depressive disorder), severe (HCC) Long Term Goal(s): Improvement in symptoms so as ready for discharge  Short Term Goals: Compliance with prescribed medications will improve  Physician Treatment Plan for Secondary Diagnosis: Principal Problem:   MDD (major depressive disorder), severe (HCC) Active Problems:   Anxiety  I certify that inpatient services furnished can reasonably be expected to improve the patient's condition.    Fay Hoop, PA-C 6/18/20251:01 PM

## 2023-08-07 NOTE — BH IP Treatment Plan (Signed)
 Interdisciplinary Treatment and Diagnostic Plan Update  08/07/2023 Time of Session: 10:15AM Taylor [name] MRN: [id number]  Principal Diagnosis: MDD (major depressive disorder), severe (HCC)  Secondary Diagnoses: Principal Problem:   MDD (major depressive disorder), severe (HCC) Active Problems:   Anxiety   Current Medications:  Current Facility-Administered Medications  Medication Dose Route Frequency Provider Last Rate Last Admin   acetaminophen (TYLENOL) tablet 650 mg  650 mg Oral Q6H PRN Coleman, Carolyn H, NP       alum & mag hydroxide-simeth (MAALOX/MYLANTA) 200-200-20 MG/5ML suspension 30 mL  30 mL Oral Q4H PRN Coleman, Carolyn H, NP       amLODipine (NORVASC) tablet 5 mg  5 mg Oral Daily Costella Dirks, NP       busPIRone (BUSPAR) tablet 30 mg  30 mg Oral BID Coleman, Carolyn H, NP   30 mg at 08/07/23 0759   cloNIDine (CATAPRES) tablet 0.1 mg  0.1 mg Oral QHS Coleman, Carolyn H, NP   0.1 mg at 08/06/23 2107   haloperidol (HALDOL) tablet 5 mg  5 mg Oral TID PRN Costella Dirks, NP       And   diphenhydrAMINE (BENADRYL) capsule 50 mg  50 mg Oral TID PRN Costella Dirks, NP       haloperidol lactate (HALDOL) injection 5 mg  5 mg Intramuscular TID PRN Costella Dirks, NP       And   diphenhydrAMINE (BENADRYL) injection 50 mg  50 mg Intramuscular TID PRN Coleman, Carolyn H, NP       And   LORazepam (ATIVAN) injection 2 mg  2 mg Intramuscular TID PRN Costella Dirks, NP       haloperidol lactate (HALDOL) injection 10 mg  10 mg Intramuscular TID PRN Costella Dirks, NP       And   diphenhydrAMINE (BENADRYL) injection 50 mg  50 mg Intramuscular TID PRN Coleman, Carolyn H, NP       And   LORazepam (ATIVAN) injection 2 mg  2 mg Intramuscular TID PRN Costella Dirks, NP       lamoTRIgine (LAMICTAL XR) 24 hour tablet 200 mg  200 mg Oral Daily Coleman, Carolyn H, NP   200 mg at 08/07/23 0800   magnesium hydroxide (MILK OF MAGNESIA) suspension 30 mL  30 mL Oral  Daily PRN Coleman, Carolyn H, NP       melatonin tablet 5 mg  5 mg Oral QHS PRN Coleman, Carolyn H, NP       metroNIDAZOLE (METROGEL) 0.75 % gel   Topical QHS Costella Dirks, NP       multivitamin (PROSIGHT) tablet 1 tablet  1 tablet Oral Q1200 Madelynn Schilder, RPH       spironolactone (ALDACTONE) tablet 25 mg  25 mg Oral Daily Coleman, Carolyn H, NP   25 mg at 08/07/23 0800   traZODone (DESYREL) tablet 50 mg  50 mg Oral QHS PRN Coleman, Carolyn H, NP   50 mg at 08/06/23 2107   PTA Medications: Medications Prior to Admission  Medication Sig Dispense Refill Last Dose/Taking   ALPRAZolam (XANAX) 0.25 MG tablet Take 0.25 mg by mouth at bedtime as needed for anxiety.      amLODipine (NORVASC) 5 MG tablet Take 5 mg by mouth daily.      Ashwagandha 500 MG CAPS Take 1 capsule by mouth daily.      busPIRone (BUSPAR) 15 MG tablet Take 15 mg by mouth 2 (two) times daily.  fexofenadine (ALLEGRA) 180 MG tablet Take 180 mg by mouth daily.      fluticasone (FLONASE) 50 MCG/ACT nasal spray Place 2 sprays into both nostrils daily.      lamoTRIgine (LAMICTAL) 100 MG tablet Take 100 mg by mouth daily.      Melatonin 1 MG CAPS Take 1 capsule by mouth at bedtime as needed (sleep).      Multiple Vitamin (MULTIVITAMIN) capsule Take 1 capsule by mouth daily.      spironolactone (ALDACTONE) 25 MG tablet Take 25 mg by mouth daily.       Patient Stressors: Traumatic event    Patient Strengths: Ability for insight  Active sense of humor  Average or above average intelligence  Capable of independent living  Physical Health   Treatment Modalities: Medication Management, Group therapy, Case management,  1 to 1 session with clinician, Psychoeducation, Recreational therapy.   Physician Treatment Plan for Primary Diagnosis: MDD (major depressive disorder), severe (HCC) Long Term Goal(s):     Short Term Goals:    Medication Management: Evaluate patient's response, side effects, and tolerance of  medication regimen.  Therapeutic Interventions: 1 to 1 sessions, Unit Group sessions and Medication administration.  Evaluation of Outcomes: Not Met  Physician Treatment Plan for Secondary Diagnosis: Principal Problem:   MDD (major depressive disorder), severe (HCC) Active Problems:   Anxiety  Long Term Goal(s):     Short Term Goals:       Medication Management: Evaluate patient's response, side effects, and tolerance of medication regimen.  Therapeutic Interventions: 1 to 1 sessions, Unit Group sessions and Medication administration.  Evaluation of Outcomes: Not Met   RN Treatment Plan for Primary Diagnosis: MDD (major depressive disorder), severe (HCC) Long Term Goal(s): Knowledge of disease and therapeutic regimen to maintain health will improve  Short Term Goals: Ability to remain free from injury will improve, Ability to verbalize frustration and anger appropriately will improve, Ability to demonstrate self-control, Ability to participate in decision making will improve, Ability to verbalize feelings will improve, Ability to disclose and discuss suicidal ideas, Ability to identify and develop effective coping behaviors will improve, and Compliance with prescribed medications will improve  Medication Management: RN will administer medications as ordered by provider, will assess and evaluate patient's response and provide education to patient for prescribed medication. RN will report any adverse and/or side effects to prescribing provider.  Therapeutic Interventions: 1 on 1 counseling sessions, Psychoeducation, Medication administration, Evaluate responses to treatment, Monitor vital signs and CBGs as ordered, Perform/monitor CIWA, COWS, AIMS and Fall Risk screenings as ordered, Perform wound care treatments as ordered.  Evaluation of Outcomes: Not Met   LCSW Treatment Plan for Primary Diagnosis: MDD (major depressive disorder), severe (HCC) Long Term Goal(s): Safe transition to  appropriate next level of care at discharge, Engage patient in therapeutic group addressing interpersonal concerns.  Short Term Goals: Engage patient in aftercare planning with referrals and resources, Increase social support, Increase ability to appropriately verbalize feelings, Increase emotional regulation, Facilitate acceptance of mental health diagnosis and concerns, and Increase skills for wellness and recovery  Therapeutic Interventions: Assess for all discharge needs, 1 to 1 time with Social worker, Explore available resources and support systems, Assess for adequacy in community support network, Educate family and significant other(s) on suicide prevention, Complete Psychosocial Assessment, Interpersonal group therapy.  Evaluation of Outcomes: Not Met   Progress in Treatment: Attending groups: Yes. Participating in groups: Yes. Taking medication as prescribed: Yes. Toleration medication: Yes. Family/Significant other contact made:  No, will contact:  once permission has been granted Patient understands diagnosis: Yes. Discussing patient identified problems/goals with staff: Yes. Medical problems stabilized or resolved: Yes. Denies suicidal/homicidal ideation: Yes. Issues/concerns per patient self-inventory: No. Other: none  New problem(s) identified: No, Describe:  none  New Short Term/Long Term Goal(s): detox, elimination of symptoms of psychosis, medication management for mood stabilization; elimination of SI thoughts; development of comprehensive mental wellness/sobriety plan.   Patient Goals:  get to the point where I can continue outpatient therapy  Discharge Plan or Barriers: CSW to assist patient in development of appropriate discharge plans.  Patient reports that she wants to return to her home with her family.    Reason for Continuation of Hospitalization: Anxiety Depression Medication stabilization Suicidal ideation Withdrawal symptoms  Estimated Length of  Stay:  1-7 days  Last 3 [place] Suicide Severity Risk Score: Flowsheet Row Admission (Current) from 08/06/2023 in [facility] INPATIENT BEHAVIORAL MEDICINE  C-SSRS RISK CATEGORY Low Risk    Last PHQ 2/9 Scores:     No data to display          Scribe for Treatment Team: Larri Ply, LCSW 08/07/2023 10:50 AM

## 2023-08-07 NOTE — Group Note (Signed)
 [facility] LCSW Group Therapy Note   Group Date: 08/07/2023 Start Time: 1300 End Time: 1400   Type of Therapy/Topic:  Group Therapy:  Emotion Regulation  Participation Level:  Minimal    Description of Group:    The purpose of this group is to assist patients in learning to regulate negative emotions and experience positive emotions. Patients will be guided to discuss ways in which they have been vulnerable to their negative emotions. These vulnerabilities will be juxtaposed with experiences of positive emotions or situations, and patients challenged to use positive emotions to combat negative ones. Special emphasis will be placed on coping with negative emotions in conflict situations, and patients will process healthy conflict resolution skills.  Therapeutic Goals: Patient will identify two positive emotions or experiences to reflect on in order to balance out negative emotions:  Patient will label two or more emotions that they find the most difficult to experience:  Patient will be able to demonstrate positive conflict resolution skills through discussion or role plays:   Summary of Patient Progress: Patient came into the group late in the discussion. She however did share that it is important to talk about emotional regulation because it helps us  communicate better in our relationships. Pt appeared to attend to the conversation while she was in the room. She appeared open and receptive to feedback/comments from both her peers and the facilitator.    Therapeutic Modalities:   Cognitive Behavioral Therapy Feelings Identification Dialectical Behavioral Therapy   Randolm Butte, LCSW

## 2023-08-07 NOTE — Plan of Care (Signed)
  Problem: Education: Goal: Emotional status will improve Outcome: Progressing Goal: Mental status will improve Outcome: Progressing Goal: Verbalization of understanding the information provided will improve Outcome: Progressing   Problem: Activity: Goal: Interest or engagement in activities will improve Outcome: Progressing Goal: Sleeping patterns will improve Outcome: Progressing   Problem: Coping: Goal: Ability to verbalize frustrations and anger appropriately will improve Outcome: Progressing Goal: Ability to demonstrate self-control will improve Outcome: Progressing   Problem: Health Behavior/Discharge Planning: Goal: Identification of resources available to assist in meeting health care needs will improve Outcome: Progressing   

## 2023-08-07 NOTE — Group Note (Signed)
 Date:  08/07/2023 Time:  2:36 PM  Group Topic/Focus:  Goals Group:   The focus of this group is to help patients establish daily goals to achieve during treatment and discuss how the patient can incorporate goal setting into their daily lives to aide in recovery.    Participation Level:  Active  Participation Quality:  Appropriate  Affect:  Appropriate  Cognitive:  Appropriate  Insight: Appropriate  Engagement in Group:  Engaged  Modes of Intervention:  Discussion, Education, and Support  Additional Comments:    Laverne Potter 08/07/2023, 2:36 PM

## 2023-08-07 NOTE — Group Note (Signed)
 Date:  08/07/2023 Time:  9:18 PM  Group Topic/Focus:  Wellness Toolbox:   The focus of this group is to discuss various aspects of wellness, balancing those aspects and exploring ways to increase the ability to experience wellness.  Patients will create a wellness toolbox for use upon discharge.    Participation Level:  Active  Participation Quality:  Appropriate, Attentive, Sharing, and Supportive  Affect:  Appropriate  Cognitive:  Appropriate  Insight: Appropriate  Engagement in Group:  Engaged  Modes of Intervention:  Discussion  Additional Comments:     Fabiola Holy 08/07/2023, 9:18 PM

## 2023-08-07 NOTE — Progress Notes (Signed)
   08/07/23 1000  Psych Admission Type (Psych Patients Only)  Admission Status Voluntary  Psychosocial Assessment  Patient Complaints None  Eye Contact Fair  Facial Expression Animated  Affect Appropriate to circumstance  Speech Logical/coherent  Interaction Assertive  Motor Activity Slow  Appearance/Hygiene Unremarkable  Behavior Characteristics Cooperative;Appropriate to situation  Mood Pleasant  Thought Process  Coherency WDL  Content WDL  Delusions None reported or observed  Perception WDL  Hallucination None reported or observed  Judgment Impaired  Confusion WDL  Danger to Self  Current suicidal ideation? Denies  Agreement Not to Harm Self Yes  Description of Agreement verbal  Danger to Others  Danger to Others None reported or observed   Patient appropriate with staff and peers. No PRNs needed. Support and encouragement given.

## 2023-08-08 DIAGNOSIS — F322 Major depressive disorder, single episode, severe without psychotic features: Secondary | ICD-10-CM | POA: Diagnosis not present

## 2023-08-08 NOTE — [facility] Suicide Risk Assessment (Cosign Needed)
 [facility] Admission Suicide Risk Assessment   Nursing information obtained from:  Patient Demographic factors:  NA Current Mental Status:  Suicidal ideation indicated by others Loss Factors:  NA Historical Factors:  Impulsivity Risk Reduction Factors:  Positive social support, Positive therapeutic relationship, Sense of responsibility to family  Total Time spent with patient: 1 hour Principal Problem: MDD (major depressive disorder), severe (HCC) Diagnosis:  Principal Problem:   MDD (major depressive disorder), severe (HCC) Active Problems:   Anxiety  Subjective Data: Previous dx of anxiety, depression, and postpartum depression. Has had previous trials of celexa, wellbutrin, buspar, lamictal, prozac(she is uncertain), and cymbalta. Notes best response to Cymbalta. Has been on Lamictal since 2023 and Buspar since 2022, prior to that had been on no medications since 2012. Previously saw Dr. Clarnce Crow and is seeing Sharry Deem at [facility] for medication management. Notes they have never seen a psychiatrist. Followed with psychology and is now seeing christian based counseling. Notes has struggled with anxiety both generalized and social anxiety her entire life. Notes that her relationship with children is strained following her husband having an affair notes that she has reconciled with her husband they went to counseling and also engaged in EMDR, feels she has coped well with this but children have been less forgiving. On Sunday she notes her children did not come and visit their father, and she felt hurt by this because her husband has worked so hard. States felt like she had a good day was out with friends and went to church. States she woke up Monday morning and had coffee with her dogs then she drove to her main residence to get some clothes for work. The house was empty and she was sad that the kids had not come yesterday she started drinking mini moonshine bottles and noted that she had  several then states she wanted to feel numb, she wanted some respite from her anxiety and the hurt of things changing with children. Also notes stressor of mom recently developed dementia and she has difficulty sleeping. Notes that menaupose has disrupted sleep. Sleeps everynight but wakes frequently. She will sometimes utilize her husbands ambien usually 4 times a month and notes that even with that she has nightime wakenings. At this point she took 58 ambien indicates that there was a full bottle and she only took 4 after taking the ambien she looked up and found that this was not a fatal dose. She then panicked and started writing letters to her family incase something happened when questioned about why she wrote the letters to the family she notes that she was worried if something happened. Then discussed why not ask for help call 911 or the husband in that situation she reports that she was too proud to ask for help and felt that if something did happened she did not want her family to find her. They have already signed the 72 hour form. They deny current si/hi/avh. PDMP is reviewed. Gabapentin was not reordered on admission. Discussed addition of remeron and pt was agreeable.     Continued Clinical Symptoms:  Alcohol Use Disorder Identification Test Final Score (AUDIT): 2 The Alcohol Use Disorders Identification Test, Guidelines for Use in Primary Care, Second Edition.  World Science writer [facility]). Score between 0-7:  no or low risk or alcohol related problems. Score between 8-15:  moderate risk of alcohol related problems. Score between 16-19:  high risk of alcohol related problems. Score 20 or above:  warrants further diagnostic evaluation  for alcohol dependence and treatment.   CLINICAL FACTORS:   Previous Psychiatric Diagnoses and Treatments   Musculoskeletal: Strength & Muscle Tone: within normal limits Gait & Station: normal Patient leans: N/A  Psychiatric Specialty  Exam:  Presentation  General Appearance:  Casual  Eye Contact: Fair  Speech: Clear and Coherent  Speech Volume: Normal  Handedness:No data recorded  Mood and Affect  Mood: Euthymic  Affect: Congruent   Thought Process  Thought Processes: Coherent  Descriptions of Associations:No data recorded Orientation:Full (Time, Place and Person)  Thought Content:Logical  History of Schizophrenia/Schizoaffective disorder:No data recorded Duration of Psychotic Symptoms:No data recorded Hallucinations:Hallucinations: None  Ideas of Reference:None  Suicidal Thoughts:Suicidal Thoughts: No  Homicidal Thoughts:Homicidal Thoughts: No   Sensorium  Memory: Immediate Fair; Recent Fair  Judgment: Fair  Insight: Fair   Art therapist  Concentration: Fair  Attention Span:No data recorded Recall:No data recorded Fund of Knowledge:No data recorded Language:No data recorded  Psychomotor Activity  Psychomotor Activity: Psychomotor Activity: Normal   Assets  Assets: Manufacturing systems engineer; Desire for Improvement   Sleep  Sleep: Sleep: Fair    Physical Exam: Physical Exam Vitals and nursing note reviewed.  HENT:     Head: Atraumatic.   Eyes:     Extraocular Movements: Extraocular movements intact.   Pulmonary:     Effort: Pulmonary effort is normal.   Neurological:     Mental Status: She is alert and oriented to person, place, and time.    Review of Systems  Psychiatric/Behavioral:  Negative for hallucinations, substance abuse and suicidal ideas.    Blood pressure (!) 141/88, pulse 71, temperature (!) 97.3 F (36.3 C), resp. rate 18, height 5' 6 (1.676 m), weight 73.5 kg, SpO2 100%. Body mass index is 26.15 kg/m.   COGNITIVE FEATURES THAT CONTRIBUTE TO RISK:  None    SUICIDE RISK:   Moderate:  Frequent suicidal ideation with limited intensity, and duration, some specificity in terms of plans, no associated intent, good self-control,  limited dysphoria/symptomatology, some risk factors present, and identifiable protective factors, including available and accessible social support.  PLAN OF CARE: Safety and Monitoring:   --  Voluntary admission to inpatient psychiatric unit for safety, stabilization and treatment -- Daily contact with patient to assess and evaluate symptoms and progress in treatment -- Patient's case to be discussed in multi-disciplinary team meeting -- Observation Level : q15 minute checks -- Vital signs:  q12 hours -- Precautions: suicide   I certify that inpatient services furnished can reasonably be expected to improve the patient's condition.   Fay Hoop, PA-C 08/08/2023, 8:16 PM

## 2023-08-08 NOTE — Plan of Care (Signed)
   Problem: Education: Goal: Knowledge of [place] [facility] Education information/materials will improve Outcome: Progressing

## 2023-08-08 NOTE — Progress Notes (Signed)
   08/08/23 0900  Charting Type  Charting Type Shift assessment  Safety Check Verification  Has the RN verified the 15 minute safety check completion? Yes  Neurological  Neuro (WDL) WDL  HEENT  HEENT (WDL) WDL  Respiratory  Respiratory (WDL) WDL  Cardiac  Cardiac (WDL) WDL  Vascular  Vascular (WDL) WDL  Integumentary  Integumentary (WDL) WDL  Braden Scale (Ages 8 and up)  Sensory Perceptions 4  Moisture 4  Activity 4  Mobility 4  Nutrition 3  Friction and Shear 3  Braden Scale Score 22  Musculoskeletal  Musculoskeletal (WDL) WDL  Gastrointestinal  Gastrointestinal (WDL) WDL  GU Assessment  Genitourinary (WDL) WDL  Neurological  Level of Consciousness Alert

## 2023-08-08 NOTE — Progress Notes (Signed)
 Pt calm and pleasant during assessment denying SI/HI/AVH. Pt stated she had a good day. Pt observed by this Clinical research associate interacting appropriately with staff and peers on the unit. Pt compliant with medication administration per MD orders. Pt given education, support, and encouragement to be active in her treatment plan. Pt being monitored Q 15 minutes for safety per unit protocol, remains safe on the unit

## 2023-08-08 NOTE — Progress Notes (Signed)
 [facility] MD Progress Note  08/08/2023 9:50 AM Taylor [name]  MRN:  [id number]   Subjective:  Chart reviewed, case discussed in multidisciplinary meeting, patient seen during rounds.   6/19: They are seen for follow up today. They are alert and oriented. They are pleasant and cooperative on exam. They note they tolerated them remeron well overnight, some drowsiness on waking. Notes they slept well, woke twice to go to the restroom. They continue to deny SI, HI, and AVH. They had a visit with their husband last night, which went well. He is supportive and  brought her home supplies. Demonstrates good insight into the need for outpatient follow up and medication compliance. They rescind 72 hour form. They are optimistic about discharge Saturday or Sunday. They voice no concerns or complaints at this time. Depression 1/10, Reports anxiety is low 2/10. PRN trazodone overnight.    Sleep: Good  Appetite:  Good  Past Psychiatric History: see h&P Family History:  Family History  Problem Relation Age of Onset   Hypercholesterolemia Mother    Thyroid  disease Mother    Heart attack Father    Hypertension Father    Hypercholesterolemia Father    Hypertension Brother    Heart attack Other    Heart disease Other    Hypertension Other    Social History:  Social History   Substance and Sexual Activity  Alcohol Use No   Comment: Rare     Social History   Substance and Sexual Activity  Drug Use No    Social History   Socioeconomic History   Marital status: Married    Spouse name: Not on file   Number of children: 2   Years of education: Not on file   Highest education level: Not on file  Occupational History   Occupation: office work  Tobacco Use   Smoking status: Never   Smokeless tobacco: Never  Substance and Sexual Activity   Alcohol use: No    Comment: Rare   Drug use: No   Sexual activity: Not on file  Other Topics Concern   Not on file  Social History Narrative   Not on  file   Social Drivers of Health   Financial Resource Strain: Not on file  Food Insecurity: No Food Insecurity (08/06/2023)   Hunger Vital Sign    Worried About Running Out of Food in the Last Year: Never true    Ran Out of Food in the Last Year: Never true  Transportation Needs: No Transportation Needs (08/06/2023)   PRAPARE - Administrator, Civil Service (Medical): No    Lack of Transportation (Non-Medical): No  Physical Activity: Not on file  Stress: Not on file  Social Connections: Socially Integrated (08/06/2023)   Social Connection and Isolation Panel    Frequency of Communication with Friends and Family: Three times a week    Frequency of Social Gatherings with Friends and Family: Once a week    Attends Religious Services: 1 to 4 times per year    Active Member of [employer] or Organizations: No    Attends Banker Meetings: 1 to 4 times per year    Marital Status: Married   Past Medical History:  Past Medical History:  Diagnosis Date   Anxiety    Benign essential HTN    Chest discomfort 07/27/2020   Gestational diabetes    Obesity    Preeclampsia     Past Surgical History:  Procedure Laterality Date  CESAREAN SECTION  1999 / 2002   X2   ENDOMETRIAL ABLATION  2008   NASAL SEPTUM SURGERY     X2   SPINAL FUSION  2005   L4-5  X2    Current Medications: Current Facility-Administered Medications  Medication Dose Route Frequency Provider Last Rate Last Admin   acetaminophen (TYLENOL) tablet 650 mg  650 mg Oral Q6H PRN Coleman, Carolyn H, NP       alum & mag hydroxide-simeth (MAALOX/MYLANTA) 200-200-20 MG/5ML suspension 30 mL  30 mL Oral Q4H PRN Coleman, Carolyn H, NP       busPIRone (BUSPAR) tablet 30 mg  30 mg Oral BID Coleman, Carolyn H, NP   30 mg at 08/08/23 0833   cloNIDine (CATAPRES) tablet 0.1 mg  0.1 mg Oral QHS Coleman, Carolyn H, NP   0.1 mg at 08/07/23 2122   haloperidol (HALDOL) tablet 5 mg  5 mg Oral TID PRN Costella Dirks, NP        And   diphenhydrAMINE (BENADRYL) capsule 50 mg  50 mg Oral TID PRN Costella Dirks, NP       haloperidol lactate (HALDOL) injection 5 mg  5 mg Intramuscular TID PRN Costella Dirks, NP       And   diphenhydrAMINE (BENADRYL) injection 50 mg  50 mg Intramuscular TID PRN Coleman, Carolyn H, NP       And   LORazepam (ATIVAN) injection 2 mg  2 mg Intramuscular TID PRN Costella Dirks, NP       haloperidol lactate (HALDOL) injection 10 mg  10 mg Intramuscular TID PRN Coleman, Carolyn H, NP       And   diphenhydrAMINE (BENADRYL) injection 50 mg  50 mg Intramuscular TID PRN Coleman, Carolyn H, NP       And   LORazepam (ATIVAN) injection 2 mg  2 mg Intramuscular TID PRN Coleman, Carolyn H, NP       fluticasone (FLONASE) 50 MCG/ACT nasal spray 1 spray  1 spray Each Nare Daily Sharalee Witman E, PA-C   1 spray at 08/08/23 0834   gabapentin (NEURONTIN) capsule 300 mg  300 mg Oral TID Giavonna Pflum E, PA-C   300 mg at 08/08/23 2841   lamoTRIgine (LAMICTAL XR) 24 hour tablet 200 mg  200 mg Oral Daily Coleman, Carolyn H, NP   200 mg at 08/08/23 0834   loratadine (CLARITIN) tablet 10 mg  10 mg Oral Daily PRN Avan Gullett E, PA-C       magnesium hydroxide (MILK OF MAGNESIA) suspension 30 mL  30 mL Oral Daily PRN Coleman, Carolyn H, NP   30 mL at 08/07/23 1726   melatonin tablet 5 mg  5 mg Oral QHS PRN Coleman, Carolyn H, NP       metroNIDAZOLE (METROGEL) 0.75 % gel   Topical QHS Costella Dirks, NP   Given at 08/07/23 2122   mirtazapine (REMERON) tablet 15 mg  15 mg Oral QHS Riddik Senna E, PA-C   15 mg at 08/07/23 2122   multivitamin (PROSIGHT) tablet 1 tablet  1 tablet Oral Q1200 Madelynn Schilder, RPH   1 tablet at 08/07/23 1242   spironolactone (ALDACTONE) tablet 25 mg  25 mg Oral Daily Coleman, Carolyn H, NP   25 mg at 08/08/23 0835   traZODone (DESYREL) tablet 50 mg  50 mg Oral QHS PRN Coleman, Carolyn H, NP   50 mg at 08/07/23 2122    Lab Results: No results found  for this or any previous visit (from the past 48 hours).  Blood Alcohol level:  No results found for: [facility]  Metabolic Disorder Labs: No results found for: HGBA1C, MPG No results found for: PROLACTIN Lab Results  Component Value Date   CHOL 189 12/13/2021   TRIG 57 12/13/2021   HDL 52 12/13/2021   CHOLHDL 3.6 12/13/2021   LDLCALC 126 (H) 12/13/2021    Physical Findings: AIMS:  , ,  ,  ,    CIWA:    COWS:      Psychiatric Specialty Exam:  Presentation  General Appearance:  Casual  Eye Contact: Fair  Speech: Clear and Coherent  Speech Volume: Normal    Mood and Affect  Mood: Euthymic  Affect: Congruent   Thought Process  Thought Processes: Coherent  Descriptions of Associations:No data recorded Orientation:Full (Time, Place and Person)  Thought Content:Logical  Hallucinations:Hallucinations: None  Ideas of Reference:None  Suicidal Thoughts:Suicidal Thoughts: No  Homicidal Thoughts:Homicidal Thoughts: No   Sensorium  Memory: Immediate Fair; Recent Fair  Judgment: Fair  Insight: Fair   Art therapist  Concentration: Fair  Attention Span:No data recorded Recall:No data recorded Fund of Knowledge:No data recorded Language:No data recorded  Psychomotor Activity  Psychomotor Activity: Psychomotor Activity: Normal  Musculoskeletal: Strength & Muscle Tone: within normal limits Gait & Station: normal Assets  Assets: Manufacturing systems engineer; Desire for Improvement    Physical Exam: Physical Exam Vitals and nursing note reviewed.  HENT:     Head: Atraumatic.   Eyes:     Extraocular Movements: Extraocular movements intact.   Pulmonary:     Effort: Pulmonary effort is normal.   Neurological:     Mental Status: She is alert and oriented to person, place, and time.    Review of Systems  Psychiatric/Behavioral:  Negative for depression, hallucinations, memory loss, substance abuse and suicidal ideas. The patient  is not nervous/anxious and does not have insomnia.    Blood pressure 104/74, pulse 69, temperature 98.2 F (36.8 C), resp. rate 16, height 5' 6 (1.676 m), weight 73.5 kg, SpO2 98%. Body mass index is 26.15 kg/m.  Diagnosis: Principal Problem:   MDD (major depressive disorder), severe (HCC) Active Problems:   Anxiety   PLAN: Safety and Monitoring:  -- Voluntary admission to inpatient psychiatric unit for safety, stabilization and treatment  -- Daily contact with patient to assess and evaluate symptoms and progress in treatment  -- Patient's case to be discussed in multi-disciplinary team meeting  -- Observation Level : q15 minute checks  -- Vital signs:  q12 hours  -- Precautions: suicide, elopement, and assault -- Encouraged patient to participate in unit milieu and in scheduled group therapies  2. Psychiatric Diagnoses and Treatment:              - MDD: Continue lamictal 200 mg daily             - GAD continue buspar 30 mg BID             - start remeron 15 mg nightly              - Will get collateral from husband             - 72 hour form signed   -- The risks/benefits/side-effects/alternatives to this medication were discussed in detail with the patient and time was given for questions. The patient consents to medication trial.                --  Metabolic profile and EKG monitoring obtained while on an atypical antipsychotic (BMI: Lipid Panel: HbgA1c: QTc:)              -- Encouraged patient to participate in unit milieu and in scheduled group therapies                            3. Medical Issues Being Addressed:   Stop norvasc pt no longer takes. - continue clonidine, gabapentinmetrogel, aldactone  htn, neuropathy 4. Discharge Planning:              -- Social work and case management to assist with discharge planning and identification of hospital follow-up needs prior to discharge             -- Estimated LOS: 5-7 days             -- Discharge Concerns: Need to  establish a safety plan; Medication compliance and effectiveness             -- Discharge Goals: Return home with outpatient referrals follow ups  Fay Hoop, PA-C 08/08/2023, 9:50 AM

## 2023-08-08 NOTE — Progress Notes (Signed)
   08/08/23 0900  Psych Admission Type (Psych Patients Only)  Admission Status Voluntary  Psychosocial Assessment  Patient Complaints None  Eye Contact Fair  Facial Expression Animated  Affect Appropriate to circumstance  Speech Logical/coherent  Interaction Assertive  Motor Activity Slow  Appearance/Hygiene Unremarkable  Behavior Characteristics Combative  Mood Pleasant  Thought Process  Coherency WDL  Content WDL  Delusions None reported or observed  Perception WDL  Hallucination None reported or observed  Judgment Limited  Confusion WDL  Danger to Self  Current suicidal ideation? Denies  Agreement Not to Harm Self Yes

## 2023-08-08 NOTE — Group Note (Signed)
 Recreation Therapy Group Note   Group Topic:Coping Skills  Group Date: 08/08/2023 Start Time: 1530 End Time: 1615 Facilitators: Yvonna Herder, CTRS Location: Craft Room  Group Description: Coping A-Z. LRT and patients engage in a guided discussion on what coping skills are and gave specific examples. LRT passed out a handout labeled Coping A-Z with blank spaces beside each letter. LRT prompted patients to come up with a coping skill for each of the letters. LRT and patients went over the handout and gave ideas for each letter if anyone had any blanks left on their paper. Patients kept this handout with them that listed 26 different coping skills.   Goal Area(s) Addressed: Patients will be able to define "coping skills". Patient will identify new coping skills.  Patient will increase communication.   Affect/Mood: Appropriate   Participation Level: Active and Engaged   Participation Quality: Independent   Behavior: Appropriate, Calm, and Cooperative   Speech/Thought Process: Coherent   Insight: Good   Judgement: Good   Modes of Intervention: Education, Exploration, Worksheet, and Writing   Patient Response to Interventions:  Attentive, Engaged, Interested , and Receptive   Education Outcome:  Acknowledges education   Clinical Observations/Individualized Feedback: Taylor [name] was active in their participation of session activities and group discussion. Pt identified laughing and walking outside as coping skills.    Plan: Continue to engage patient in RT group sessions 2-3x/week.   Deatrice Factor, LRT, CTRS 08/08/2023 5:29 PM

## 2023-08-08 NOTE — Progress Notes (Signed)
 Pt rescinded her 72 hour D/C form

## 2023-08-08 NOTE — Plan of Care (Signed)
   Problem: Education: Goal: Emotional status will improve Outcome: Progressing Goal: Mental status will improve Outcome: Progressing

## 2023-08-08 NOTE — Group Note (Signed)
 [facility] LCSW Group Therapy Note   Group Date: 08/08/2023 Start Time: 1310 End Time: 1400   Type of Therapy/Topic:  Group Therapy:  Balance in Life  Participation Level:  Active   Description of Group:    This group will address the concept of balance and how it feels and looks when one is unbalanced. Patients will be encouraged to process areas in their lives that are out of balance, and identify reasons for remaining unbalanced. Facilitators will guide patients utilizing problem- solving interventions to address and correct the stressor making their life unbalanced. Understanding and applying boundaries will be explored and addressed for obtaining  and maintaining a balanced life. Patients will be encouraged to explore ways to assertively make their unbalanced needs known to significant others in their lives, using other group members and facilitator for support and feedback.  Therapeutic Goals: Patient will identify two or more emotions or situations they have that consume much of in their lives. Patient will identify signs/triggers that life has become out of balance:  Patient will identify two ways to set boundaries in order to achieve balance in their lives:  Patient will demonstrate ability to communicate their needs through discussion and/or role plays  Summary of Patient Progress: Patient was present in group.  Patient was engaged in group. Patient was supportive of other group members.  Patient was able to identify what balance means to her and ways that she can get knocked off balance.  Pt displayed fair insight.   Therapeutic Modalities:   Cognitive Behavioral Therapy Solution-Focused Therapy Assertiveness Training   Larri Ply, LCSW

## 2023-08-08 NOTE — Group Note (Signed)
 Recreation Therapy Group Note   Group Topic:Health and Wellness  Group Date: 08/08/2023 Start Time: 1000 End Time: 1100 Facilitators: Deatrice Factor, LRT, CTRS Location: Courtyard  Group Description: [employer]. LRT and patients played games of basketball, drew with chalk, and played corn hole while outside in the courtyard while getting fresh air and sunlight. Music was being played in the background. LRT and peers conversed about different games they have played before, what they do in their free time and anything else that is on their minds. LRT encouraged pts to drink water after being outside, sweating and getting their heart rate up.  Goal Area(s) Addressed: Patient will build on frustration tolerance skills. Patients will partake in a competitive play game with peers. Patients will gain knowledge of new leisure interest/hobby.    Affect/Mood: Appropriate   Participation Level: Active   Participation Quality: Independent   Behavior: Appropriate   Speech/Thought Process: Coherent   Insight: Good   Judgement: Good   Modes of Intervention: Activity   Patient Response to Interventions:  Receptive   Education Outcome:  Acknowledges education   Clinical Observations/Individualized Feedback: Pamelia was active in their participation of session activities and group discussion. Pt interacted well with LRT and peers duration of session.    Plan: Continue to engage patient in RT group sessions 2-3x/week.   Deatrice Factor, LRT, CTRS 08/08/2023 11:37 AM
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
## 2023-08-08 NOTE — Group Note (Signed)
 Date:  08/08/2023 Time:  10:53 PM  Group Topic/Focus:  Personal Choices and Values:   The focus of this group is to help patients assess and explore the importance of values in their lives, how their values affect their decisions, how they express their values and what opposes their expression.    Participation Level:  Active  Participation Quality:  Appropriate, Attentive, Sharing, and Supportive  Affect:  Appropriate and Excited  Cognitive:  Alert and Appropriate  Insight: Appropriate, Good, and Improving  Engagement in Group:  Developing/Improving, Engaged, and Supportive  Modes of Intervention:  Activity, Clarification, Discussion, Education, Exploration, Problem-solving, Rapport Building, Dance movement psychotherapist, Socialization, and Support  Additional Comments:     Wania Longstreth 08/08/2023, 10:53 PM

## 2023-08-08 NOTE — Group Note (Signed)
 Date:  08/08/2023 Time:  6:06 PM  Group Topic/Focus:  Goals Group:   The focus of this group is to help patients establish daily goals to achieve during treatment and discuss how the patient can incorporate goal setting into their daily lives to aide in recovery.  Participation Level:  Active  Participation Quality:  Appropriate  Affect:  Appropriate  Cognitive:  Appropriate  Insight: Appropriate  Engagement in Group:  Engaged  Modes of Intervention:  Activity, Discussion, and Education  Additional Comments:    Kesi Perrow A Amous Crewe 08/08/2023, 6:06 PM

## 2023-08-08 NOTE — [facility] Suicide Risk Assessment (Signed)
 [facility] INPATIENT:  Family/Significant Other Suicide Prevention Education  Suicide Prevention Education:  Education Completed; Karinna Beadles, husband, [phone]  has been identified by the patient as the family member/significant other with whom the patient will be residing, and identified as the person(s) who will aid the patient in the event of a mental health crisis (suicidal ideations/suicide attempt).  With written consent from the patient, the family member/significant other has been provided the following suicide prevention education, prior to the and/or following the discharge of the patient.  The suicide prevention education provided includes the following: Suicide risk factors Suicide prevention and interventions National Suicide Hotline telephone number [facility] assessment telephone number [facility] Emergency Assistance 911 [facility] and/or Residential Mobile Crisis Unit telephone number  Request made of family/significant other to: Remove weapons (e.g., guns, rifles, knives), all items previously/currently identified as safety concern.   Remove drugs/medications (over-the-counter, prescriptions, illicit drugs), all items previously/currently identified as a safety concern.  The family member/significant other verbalizes understanding of the suicide prevention education information provided.  The family member/significant other agrees to remove the items of safety concern listed above.  Larri Ply 08/08/2023, 3:30 PM

## 2023-08-09 DIAGNOSIS — F322 Major depressive disorder, single episode, severe without psychotic features: Secondary | ICD-10-CM | POA: Diagnosis not present

## 2023-08-09 MED ORDER — GABAPENTIN 300 MG PO CAPS: 300.0000 mg | ORAL_CAPSULE | Freq: Three times a day (TID) | ORAL | Status: AC

## 2023-08-09 MED ORDER — LAMOTRIGINE ER 200 MG PO TB24: 200.0000 mg | ORAL_TABLET | Freq: Every day | ORAL | Status: AC

## 2023-08-09 MED ORDER — MIRTAZAPINE 15 MG PO TABS: 15.0000 mg | ORAL_TABLET | Freq: Every day | ORAL | 0 refills | Status: AC

## 2023-08-09 MED ORDER — CLONIDINE HCL 0.1 MG PO TABS: 0.1000 mg | ORAL_TABLET | Freq: Every day | ORAL | Status: AC

## 2023-08-09 MED ORDER — BUSPIRONE HCL 30 MG PO TABS: 30.0000 mg | ORAL_TABLET | Freq: Two times a day (BID) | ORAL | Status: AC

## 2023-08-09 NOTE — Progress Notes (Signed)
   08/09/23 0900  Psych Admission Type (Psych Patients Only)  Admission Status Voluntary  Psychosocial Assessment  Patient Complaints None  Eye Contact Fair  Facial Expression Animated  Affect Appropriate to circumstance  Speech Logical/coherent  Interaction Assertive  Motor Activity Other (Comment) (WNL)  Appearance/Hygiene Unremarkable  Behavior Characteristics Cooperative  Mood Pleasant  Thought Process  Coherency WDL  Content WDL  Delusions None reported or observed  Perception WDL  Hallucination None reported or observed  Judgment Limited  Confusion WDL  Danger to Self  Current suicidal ideation? Denies

## 2023-08-09 NOTE — Plan of Care (Signed)
   Problem: Education: Goal: Emotional status will improve Outcome: Progressing Goal: Mental status will improve Outcome: Progressing

## 2023-08-09 NOTE — Progress Notes (Addendum)
 Discharge Note:  Patient denies SI/HI/AVH at this time. Discharge instructions, AVS, prescriptions, and transition record gone over with patient. Patient agrees to comply with medication management, follow-up visit, and outpatient therapy. Patient belongings returned to patient. Patient questions and concerns addressed and answered.

## 2023-08-09 NOTE — Group Note (Signed)
 Recreation Therapy Group Note   Group Topic:Leisure Education  Group Date: 08/09/2023 Start Time: 1530 End Time: 1630 Facilitators: Yvonna Herder, CTRS Location: Craft Room  Group Description: Leisure. Patients were given the option to choose from singing karaoke, coloring mandalas, using oil pastels, journaling, or playing with play-doh. LRT and pts discussed the meaning of leisure, the importance of participating in leisure during their free time/when they're outside of the hospital, as well as how our leisure interests can also serve as coping skills.   Goal Area(s) Addressed:  Patient will identify a current leisure interest.  Patient will learn the definition of "leisure". Patient will practice making a positive decision. Patient will have the opportunity to try a new leisure activity. Patient will communicate with peers and LRT.    Affect/Mood: N/A   Participation Level: Did not attend    Clinical Observations/Individualized Feedback: Patient did not attend group.   Plan: Continue to engage patient in RT group sessions 2-3x/week.   Deatrice Factor, LRT, CTRS 08/09/2023 5:42 PM

## 2023-08-09 NOTE — Plan of Care (Signed)
   Problem: Education: Goal: Knowledge of [facility] Education information/materials will improve Outcome: Progressing Goal: Emotional status will improve Outcome: Progressing
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
## 2023-08-09 NOTE — Progress Notes (Signed)
 [facility] MD Progress Note  08/09/2023 1:30 PM Taylor [name]  MRN:  [id number]   Subjective:  Chart reviewed, case discussed in multidisciplinary meeting, patient seen during rounds.   6/20: Patient seen for follow up today. They were alert, and oriented. They are pleasant and cooperative on exam. They note some anxiety about continuing to be hospitalized but discuss that they are making the best of it. Discuss that they have significant social support and resources outside of the hospital. They note that they are grateful to have such resources. Also note a strong faith currently a practicing methodist. They had some difficulty with sleep last night due to an alarm and the loudspeaker going off. Discussed the collateral indicating that they made statements of wanting to be with Jesus the night prior to the overdose, and that given that it is difficult to discharge them rapidly as that indicates some forethought into the overdose. Pt verbalized understanding. They continue to deny ongoing SI/HI/AVH. They feel they are not having significant ongoing depression symptoms but that anxiety is elevated due to the situation/being hospitalized. They are linear, logical, and future oriented. They demonstrate good insight into the need for outpatient follow up and medication compliance. Report they are tolerating medications well. Main concern is that they feel this is not a hospitable environment for their wellness, and that at home they have family support and their yoga. They also note they are active in the community. Also discussed some concern about healthy food options not being available here. They utilized PRN trazodone over night. They voiced no further concerns or complaints at that time.   6/19: They are seen for follow up today. They are alert and oriented. They are pleasant and cooperative on exam. They note they tolerated them remeron well overnight, some drowsiness on waking. Notes they slept well, woke  twice to go to the restroom. They continue to deny SI, HI, and AVH. They had a visit with their husband last night, which went well. He is supportive and  brought her home supplies. Demonstrates good insight into the need for outpatient follow up and medication compliance. They rescind 72 hour form. They are optimistic about discharge Saturday or Sunday. They voice no concerns or complaints at this time. Depression 1/10, Reports anxiety is low 2/10. PRN trazodone overnight.    Sleep: Good  Appetite:  Good  Past Psychiatric History: see h&P Family History:  Family History  Problem Relation Age of Onset   Hypercholesterolemia Mother    Thyroid  disease Mother    Heart attack Father    Hypertension Father    Hypercholesterolemia Father    Hypertension Brother    Heart attack Other    Heart disease Other    Hypertension Other    Social History:  Social History   Substance and Sexual Activity  Alcohol Use No   Comment: Rare     Social History   Substance and Sexual Activity  Drug Use No    Social History   Socioeconomic History   Marital status: Married    Spouse name: Not on file   Number of children: 2   Years of education: Not on file   Highest education level: Not on file  Occupational History   Occupation: office work  Tobacco Use   Smoking status: Never   Smokeless tobacco: Never  Substance and Sexual Activity   Alcohol use: No    Comment: Rare   Drug use: No   Sexual activity: Not on  file  Other Topics Concern   Not on file  Social History Narrative   Not on file   Social Drivers of Health   Financial Resource Strain: Not on file  Food Insecurity: No Food Insecurity (08/06/2023)   Hunger Vital Sign    Worried About Running Out of Food in the Last Year: Never true    Ran Out of Food in the Last Year: Never true  Transportation Needs: No Transportation Needs (08/06/2023)   PRAPARE - Administrator, Civil Service (Medical): No    Lack of  Transportation (Non-Medical): No  Physical Activity: Not on file  Stress: Not on file  Social Connections: Socially Integrated (08/06/2023)   Social Connection and Isolation Panel    Frequency of Communication with Friends and Family: Three times a week    Frequency of Social Gatherings with Friends and Family: Once a week    Attends Religious Services: 1 to 4 times per year    Active Member of Clubs or Organizations: No    Attends Engineer, structural: 1 to 4 times per year    Marital Status: Married   Past Medical History:  Past Medical History:  Diagnosis Date   Anxiety    Benign essential HTN    Chest discomfort 07/27/2020   Gestational diabetes    Obesity    Preeclampsia     Past Surgical History:  Procedure Laterality Date   CESAREAN SECTION  1999 / 2002   X2   ENDOMETRIAL ABLATION  2008   NASAL SEPTUM SURGERY     X2   SPINAL FUSION  2005   L4-5  X2    Current Medications: Current Facility-Administered Medications  Medication Dose Route Frequency Provider Last Rate Last Admin   acetaminophen (TYLENOL) tablet 650 mg  650 mg Oral Q6H PRN Coleman, Carolyn H, NP       alum & mag hydroxide-simeth (MAALOX/MYLANTA) 200-200-20 MG/5ML suspension 30 mL  30 mL Oral Q4H PRN Coleman, Carolyn H, NP       busPIRone (BUSPAR) tablet 30 mg  30 mg Oral BID Coleman, Carolyn H, NP   30 mg at 08/09/23 0826   cloNIDine (CATAPRES) tablet 0.1 mg  0.1 mg Oral QHS Coleman, Carolyn H, NP   0.1 mg at 08/08/23 2106   haloperidol (HALDOL) tablet 5 mg  5 mg Oral TID PRN Costella Dirks, NP       And   diphenhydrAMINE (BENADRYL) capsule 50 mg  50 mg Oral TID PRN Costella Dirks, NP       haloperidol lactate (HALDOL) injection 5 mg  5 mg Intramuscular TID PRN Costella Dirks, NP       And   diphenhydrAMINE (BENADRYL) injection 50 mg  50 mg Intramuscular TID PRN Coleman, Carolyn H, NP       And   LORazepam (ATIVAN) injection 2 mg  2 mg Intramuscular TID PRN Costella Dirks, NP        haloperidol lactate (HALDOL) injection 10 mg  10 mg Intramuscular TID PRN Coleman, Carolyn H, NP       And   diphenhydrAMINE (BENADRYL) injection 50 mg  50 mg Intramuscular TID PRN Coleman, Carolyn H, NP       And   LORazepam (ATIVAN) injection 2 mg  2 mg Intramuscular TID PRN Coleman, Carolyn H, NP       fluticasone (FLONASE) 50 MCG/ACT nasal spray 1 spray  1 spray Each Nare Daily Kaileb Monsanto E,  PA-C   1 spray at 08/09/23 0826   gabapentin (NEURONTIN) capsule 300 mg  300 mg Oral TID Maimuna Leaman E, PA-C   300 mg at 08/09/23 1205   lamoTRIgine (LAMICTAL XR) 24 hour tablet 200 mg  200 mg Oral Daily Coleman, Carolyn H, NP   200 mg at 08/09/23 0827   loratadine (CLARITIN) tablet 10 mg  10 mg Oral Daily PRN Angelamarie Avakian E, PA-C       magnesium hydroxide (MILK OF MAGNESIA) suspension 30 mL  30 mL Oral Daily PRN Coleman, Carolyn H, NP   30 mL at 08/07/23 1726   melatonin tablet 5 mg  5 mg Oral QHS PRN Coleman, Carolyn H, NP       metroNIDAZOLE (METROGEL) 0.75 % gel   Topical QHS Costella Dirks, NP   Given at 08/08/23 2104   mirtazapine (REMERON) tablet 15 mg  15 mg Oral QHS Kynslie Ringle E, PA-C   15 mg at 08/08/23 2105   multivitamin (PROSIGHT) tablet 1 tablet  1 tablet Oral Q1200 Madelynn Schilder, RPH   1 tablet at 08/09/23 1205   spironolactone (ALDACTONE) tablet 25 mg  25 mg Oral Daily Coleman, Carolyn H, NP   25 mg at 08/09/23 0826   traZODone (DESYREL) tablet 50 mg  50 mg Oral QHS PRN Coleman, Carolyn H, NP   50 mg at 08/08/23 2105    Lab Results: No results found for this or any previous visit (from the past 48 hours).  Blood Alcohol level:  No results found for: [facility]  Metabolic Disorder Labs: No results found for: HGBA1C, MPG No results found for: PROLACTIN Lab Results  Component Value Date   CHOL 189 12/13/2021   TRIG 57 12/13/2021   HDL 52 12/13/2021   CHOLHDL 3.6 12/13/2021   LDLCALC 126 (H) 12/13/2021    Physical Findings: AIMS:   , ,  ,  ,    CIWA:    COWS:      Psychiatric Specialty Exam:  Presentation  General Appearance:  Casual  Eye Contact: Fair  Speech: Clear and Coherent  Speech Volume: Normal    Mood and Affect  Mood: Euthymic  Affect: Congruent   Thought Process  Thought Processes: Coherent  Descriptions of Associations:Intact  Orientation:Full (Time, Place and Person)  Thought Content:Logical  Hallucinations:Hallucinations: None  Ideas of Reference:None  Suicidal Thoughts:Suicidal Thoughts: No  Homicidal Thoughts:Homicidal Thoughts: No   Sensorium  Memory: Immediate Fair; Recent Fair  Judgment: Fair  Insight: Fair   Art therapist  Concentration: Fair  Attention Span:No data recorded Recall:No data recorded Fund of Knowledge:No data recorded Language:No data recorded  Psychomotor Activity  Psychomotor Activity: Psychomotor Activity: Normal  Musculoskeletal: Strength & Muscle Tone: within normal limits Gait & Station: normal Assets  Assets: Manufacturing systems engineer; Desire for Improvement; Social Support; Vocational/Educational; Housing; Health and safety inspector; Leisure Time; Physical Health    Physical Exam: Physical Exam Vitals and nursing note reviewed.  HENT:     Head: Atraumatic.   Eyes:     Extraocular Movements: Extraocular movements intact.   Pulmonary:     Effort: Pulmonary effort is normal.   Neurological:     Mental Status: She is alert and oriented to person, place, and time.    Review of Systems  Psychiatric/Behavioral:  Negative for depression, hallucinations, memory loss, substance abuse and suicidal ideas. The patient is nervous/anxious. The patient does not have insomnia.    Blood pressure 136/79, pulse 71, temperature 98.2 F (36.8 C), resp.  rate 20, height 5' 6 (1.676 m), weight 73.5 kg, SpO2 100%. Body mass index is 26.15 kg/m.  Diagnosis: Principal Problem:   MDD (major depressive disorder), severe  (HCC) Active Problems:   Anxiety   PLAN: Safety and Monitoring:  -- Voluntary admission to inpatient psychiatric unit for safety, stabilization and treatment  -- Daily contact with patient to assess and evaluate symptoms and progress in treatment  -- Patient's case to be discussed in multi-disciplinary team meeting  -- Observation Level : q15 minute checks  -- Vital signs:  q12 hours  -- Precautions: suicide, elopement, and assault -- Encouraged patient to participate in unit milieu and in scheduled group therapies  2. Psychiatric Diagnoses and Treatment:              - MDD: Continue lamictal 200 mg daily             - GAD continue buspar 30 mg BID             - Continue remeron 15 mg nightly              - SW got collateral from husband   -- The risks/benefits/side-effects/alternatives to this medication were discussed in detail with the patient and time was given for questions. The patient consents to medication trial.                -- Metabolic profile and EKG monitoring obtained while on an atypical antipsychotic (BMI: Lipid Panel: HbgA1c: QTc:)              -- Encouraged patient to participate in unit milieu and in scheduled group therapies                            3. Medical Issues Being Addressed:   Stop norvasc pt no longer takes. - continue clonidine, gabapentinmetrogel, aldactone  htn, neuropathy 4. Discharge Planning:              -- Social work and case management to assist with discharge planning and identification of hospital follow-up needs prior to discharge             -- Estimated LOS: 5-7 days             -- Discharge Concerns: Need to establish a safety plan; Medication compliance and effectiveness             -- Discharge Goals: Return home with outpatient referrals follow ups  Fay Hoop, PA-C 08/09/2023, 1:30 PM

## 2023-08-09 NOTE — Discharge Summary (Cosign Needed Addendum)
 Physician Discharge Summary Note  Patient:  Taylor [name] is an 53 y.o., female MRN:  [id number] DOB:  08-Oct-1970 Patient phone:  [phone] (home)  Patient address:   [address] [place] [address],    Date of Admission:  08/06/2023 Date of Discharge: 08/09/2023  Reason for Admission:  53 year-old female with a psychiatric history of major depressive disorder (MDD), generalized anxiety disorder (GAD), postpartum depression, and long-standing social anxiety. She presented following ingestion of four Ambien tablets along with multiple small bottles of moonshine after an emotionally triggering day. She denied suicidal intent, but endorsed a desire to numb emotional pain stemming from strained relationships with her children, anxiety, and recent family stressors.  Principal Problem: MDD (major depressive disorder), severe (HCC) Discharge Diagnoses: Principal Problem:   MDD (major depressive disorder), severe (HCC) Active Problems:   Anxiety   Past Psychiatric History:   Psychiatric History:  Information collected from patient and chart review   Prev Dx/Sx: Anxiety and depression Current Psych Provider: seeing pcp Home Meds (current): buspar , lamictal , gabapentin   Previous Med Trials: see above Therapy: currently enrolled in christian based therapy with provider discussed in hpi.    Prior Psych Hospitalization: none  Prior Self Harm: none Prior Violence: none   Family Psych History: none Family Hx suicide: none Social History:  Social History   Substance and Sexual Activity  Alcohol Use No   Comment: Rare     Social History   Substance and Sexual Activity  Drug Use No    Social History   Socioeconomic History   Marital status: Married    Spouse name: Not on file   Number of children: 2   Years of education: Not on file   Highest education level: Not on file  Occupational History   Occupation: office work  Tobacco Use   Smoking status: Never    Smokeless tobacco: Never  Substance and Sexual Activity   Alcohol use: No    Comment: Rare   Drug use: No   Sexual activity: Not on file  Other Topics Concern   Not on file  Social History Narrative   Not on file   Social Drivers of Health   Financial Resource Strain: Not on file  Food Insecurity: No Food Insecurity (08/06/2023)   Hunger Vital Sign    Worried About Running Out of Food in the Last Year: Never true    Ran Out of Food in the Last Year: Never true  Transportation Needs: No Transportation Needs (08/06/2023)   PRAPARE - Administrator, Civil Service (Medical): No    Lack of Transportation (Non-Medical): No  Physical Activity: Not on file  Stress: Not on file  Social Connections: Socially Integrated (08/06/2023)   Social Connection and Isolation Panel    Frequency of Communication with Friends and Family: Three times a week    Frequency of Social Gatherings with Friends and Family: Once a week    Attends Religious Services: 1 to 4 times per year    Active Member of [employer] or Organizations: No    Attends Banker Meetings: 1 to 4 times per year    Marital Status: Married   Past Medical History:  Past Medical History:  Diagnosis Date   Anxiety    Benign essential HTN    Chest discomfort 07/27/2020   Gestational diabetes    Obesity    Preeclampsia     Past Surgical History:  Procedure Laterality Date   CESAREAN SECTION  1999 / 2002   X2   ENDOMETRIAL ABLATION  2008   NASAL SEPTUM SURGERY     X2   SPINAL FUSION  2005   L4-5  X2   Family History:  Family History  Problem Relation Age of Onset   Hypercholesterolemia Mother    Thyroid  disease Mother    Heart attack Father    Hypertension Father    Hypercholesterolemia Father    Hypertension Brother    Heart attack Other    Heart disease Other    Hypertension Other     Hospital Course:  During the course of hospitalization, pt received daily multiple modalities of treatments  consisting of Psychopharmacology, individual, group, psychoeducational, recreational, milieu therapy, including case management to coordinate pts inpatient and outpatient care and in concert with weekly treatment team meetings. Discharge planning was initiated on the day of admission to ensure a safe discharge. The presenting symptoms were closely monitored and medications were started as indicated.   Upon admission, the patient signed 72 hour discharge request, which she later rescinded. She was continued on Lamictal  200 mg daily and Buspar  30 mg BID. Mirtazapine  15 mg QHS was initiated for mood and sleep support. Gabapentin  was not resumed. She tolerated the medication regimen well.  The patient engaged in treatment, remained cooperative, and demonstrated improved insight into her mental health needs. She consistently denied suicidal ideation, homicidal ideation, or psychosis throughout hospitalization. She verbalized increased anxiety related to the inpatient environment but identified robust outpatient supports, including her faith, family, yoga, and community involvement. Sleep improved with Mirtazapine  and PRN Trazodone , and she denied significant ongoing depressive symptoms by the time of discharge.An incident did occur during admission where another pt exposed them self on the unit, met with patient after this incident and they were doing well, support and counseling were offered, discussed unit safety plan and pt was agreeable with remaining on unit.   It is intended for the outpatient provider to determine whether to continue these medications, or if these medication needs to be titrated for continued outpatient therapy. All identified psychiatric, general medical/surgical psychosocial obstacles to discharge were addressed. Patient tolerated these medications with no noted side effects.  Patient showed sustained symptomatic improvement before discharge. Dr. Donnelly spoke with Husband on day of  Discharge and following that discussion felt pt was appropriate for discharge, and pt was agreeable.   On the day of discharge, following sustained improvement in the affect of this patient, continued report of euthymic mood, repeated denial of suicidal, homicidal and other violent ideations, adequate interaction with peers, active participation in groups while on the unit, and denial of adverse reactions from the medications, the treatment team decided that Pt was stable for discharge with scheduled mental health treatment as below. A comprehensive risk assessment was done prior to discharge and shows that patient is at low risk for suicide or violence.    Currently, modifiable risk factors of harm to self/harm to others have been addressed and patient is no longer appropriate for the acute inpatient setting and is able to continue treatment for mental health needs in the community with the supports as indicated below.  Patient is educated and verbalized understanding of discharge plan of care including medications, follow-up appointments, mental health resources and further crisis services in the community.  He is instructed to call 911 or present to the nearest emergency room should he experience any decompensation in mood, disturbance of bowel or return of suicidal/homicidal ideations.  Patient verbalizes understanding  of this education and agrees to this plan of care  Physical Findings: AIMS:  , ,  ,  ,    CIWA:    COWS:        Psychiatric Specialty Exam:  Presentation  General Appearance:  Casual  Eye Contact: Fair  Speech: Clear and Coherent  Speech Volume: Normal    Mood and Affect  Mood: Euthymic  Affect: Congruent   Thought Process  Thought Processes: Coherent  Descriptions of Associations:Intact  Orientation:Full (Time, Place and Person)  Thought Content:Logical  Hallucinations:Hallucinations: None  Ideas of Reference:None  Suicidal Thoughts:Suicidal  Thoughts: No  Homicidal Thoughts:Homicidal Thoughts: No   Sensorium  Memory: Immediate Fair; Recent Fair  Judgment: Fair  Insight: Fair   Art therapist  Concentration: Fair  Attention Span:No data recorded Recall:No data recorded Fund of Knowledge:No data recorded Language:No data recorded  Psychomotor Activity  Psychomotor Activity: Psychomotor Activity: Normal  Musculoskeletal: Strength & Muscle Tone: within normal limits Gait & Station: normal Assets  Assets: Manufacturing systems engineer; Desire for Improvement; Social Support; Vocational/Educational; Housing; Health and safety inspector; Leisure Time; Physical Health   Sleep  Sleep: Sleep: Fair    Physical Exam: Physical Exam Vitals and nursing note reviewed.  HENT:     Head: Atraumatic.   Eyes:     Extraocular Movements: Extraocular movements intact.   Pulmonary:     Effort: Pulmonary effort is normal.   Neurological:     Mental Status: She is alert and oriented to person, place, and time.   Psychiatric:        Mood and Affect: Mood normal.        Behavior: Behavior normal.        Thought Content: Thought content normal.        Judgment: Judgment normal.    Review of Systems  Psychiatric/Behavioral:  Negative for hallucinations, substance abuse and suicidal ideas. The patient does not have insomnia.    Blood pressure 136/79, pulse 71, temperature 98.2 F (36.8 C), resp. rate 20, height 5' 6 (1.676 m), weight 73.5 kg, SpO2 100%. Body mass index is 26.15 kg/m.   Social History   Tobacco Use  Smoking Status Never  Smokeless Tobacco Never   Tobacco Cessation:  N/A, patient does not currently use tobacco products   Blood Alcohol level:  No results found for: [facility]  Metabolic Disorder Labs:  No results found for: HGBA1C, MPG No results found for: PROLACTIN Lab Results  Component Value Date   CHOL 189 12/13/2021   TRIG 57 12/13/2021   HDL 52 12/13/2021   CHOLHDL 3.6  12/13/2021   LDLCALC 126 (H) 12/13/2021    See Psychiatric Specialty Exam and Suicide Risk Assessment completed by Attending Physician prior to discharge.  Discharge destination:  Home  Is patient on multiple antipsychotic therapies at discharge:  No   Has Patient had three or more failed trials of antipsychotic monotherapy by history:  No  Recommended Plan for Multiple Antipsychotic Therapies: NA  Discharge Instructions     Diet - low sodium heart healthy   Complete by: As directed    Increase activity slowly   Complete by: As directed       Allergies as of 08/09/2023       Reactions   Celexa [citalopram] Other (See Comments)   ineffective   Cymbalta [duloxetine Hcl] Other (See Comments)   Intolerant   Hydrochlorothiazide Other (See Comments)   Hypokalemia        Medication List     STOP  taking these medications    ALPRAZolam 0.25 MG tablet Commonly known as: XANAX   amLODipine  5 MG tablet Commonly known as: NORVASC    Ashwagandha 500 MG Caps   fexofenadine 180 MG tablet Commonly known as: ALLEGRA   lamoTRIgine  100 MG tablet Commonly known as: LAMICTAL  Replaced by: LamoTRIgine  200 MG Tb24 24 hour tablet   Melatonin 1 MG Caps   multivitamin capsule       TAKE these medications      Indication  busPIRone  30 MG tablet Commonly known as: BUSPAR  Take 1 tablet (30 mg total) by mouth 2 (two) times daily. What changed:  medication strength how much to take  Indication: Anxiety Disorder   cloNIDine  0.1 MG tablet Commonly known as: CATAPRES  Take 1 tablet (0.1 mg total) by mouth at bedtime.  Indication: High Blood Pressure   fluticasone  50 MCG/ACT nasal spray Commonly known as: FLONASE  Place 2 sprays into both nostrils daily.  Indication: Allergic Rhinitis   gabapentin  300 MG capsule Commonly known as: NEURONTIN  Take 1 capsule (300 mg total) by mouth 3 (three) times daily.  Indication: continue prescribed by outpt provider   LamoTRIgine  200  MG Tb24 24 hour tablet Take 1 tablet (200 mg total) by mouth daily. Replaces: lamoTRIgine  100 MG tablet  Indication: pt reports for HA   mirtazapine  15 MG tablet Commonly known as: REMERON  Take 1 tablet (15 mg total) by mouth at bedtime.  Indication: Major Depressive Disorder   spironolactone  25 MG tablet Commonly known as: ALDACTONE  Take 25 mg by mouth daily.  Indication: High Blood Pressure        Follow-up Information     [facility] Follow up.   Why: Appointment is virtual.  After this appointment you will be scheduled for an appointment in the [facility] location.  Appointment is 08/12/2023 at 2:00PM. Please have the paperwork completed and submitted by Sunday evening.  It is a requirement and must be don for the appointment to be held Contact information: [address] [place], [place], [address] Phone: [phone] Fax: [phone]                Follow-up recommendations:  # It is recommended to the patient to continue psychiatric medications as prescribed, after discharge from the hospital.   # It is recommended to the patient to follow up with your outpatient psychiatric provider and PCP. # It was discussed with the patient, the impact of alcohol, drugs, tobacco have been there overall psychiatric and medical wellbeing, and total abstinence from substance use was recommended. # Prescriptions provided or sent directly to preferred pharmacy at discharge. Patient agreeable to plan. Given the opportunity to ask questions. Appears to feel comfortable with discharge.  # In the event of worsening symptoms, the patient is instructed to call the crisis hotline (988), 911 and or go to the nearest ED for appropriate evaluation and treatment of symptoms. To follow-up with primary care provider for other medical issues, concerns and or health care needs # Patient was discharged home as requested with a plan to follow up as noted above.       Signed: Donnice FORBES Right, PA-C 08/10/2023, 1:17 PM

## 2023-08-09 NOTE — Group Note (Signed)
 Recreation Therapy Group Note   Group Topic:Stress Management  Group Date: 08/09/2023 Start Time: 1100 End Time: 1200 Facilitators: Deatrice Factor, LRT, CTRS Location: Courtyard  Group Description: [employer]. LRT and patients played games of basketball, drew with chalk, and played corn hole while outside in the courtyard while getting fresh air and sunlight. Music was being played in the background. LRT and peers conversed about different games they have played before, what they do in their free time and anything else that is on their minds. LRT encouraged pts to drink water after being outside, sweating and getting their heart rate up.  Goal Area(s) Addressed: Patient will build on frustration tolerance skills. Patients will partake in a competitive play game with peers. Patients will gain knowledge of new leisure interest/hobby.    Affect/Mood: Appropriate   Participation Level: Active   Participation Quality: Independent   Behavior: Appropriate   Speech/Thought Process: Coherent   Insight: Good   Judgement: Good   Modes of Intervention: Activity   Patient Response to Interventions:  Receptive   Education Outcome:  Acknowledges education   Clinical Observations/Individualized Feedback: Taylor [name] was active in their participation of session activities and group discussion. Pt interacted well with LRT and peers duration of session.    Plan: Continue to engage patient in RT group sessions 2-3x/week.   [address] LRT, CTRS 08/09/2023 1:31 PM

## 2023-08-09 NOTE — Progress Notes (Signed)
  [facility] Adult Case Management Discharge Plan :  Will you be returning to the same living situation after discharge:  Yes,  pt reports that she is returning home. At discharge, do you have transportation home?: Yes,  pt reports that husband will provide transportation.  Do you have the ability to pay for your medications: Yes,   BLUE CROSS BLUE SHIELD / BCBS COMM PPO  Release of information consent forms completed and in the chart;  Patient's signature needed at discharge.  Patient to Follow up at:  Follow-up Information     [facility] Follow up.   Why: Appointment is virtual.  After this appointment you will be scheduled for an appointment in the [facility] location.  Appointment is 08/12/2023 at 2:00PM. Please have the paperwork completed and submitted by Sunday evening.  It is a requirement and must be don for the appointment to be held Contact information: [address] [place], [place], [address] Phone: [phone] Fax: [phone]                Next level of care provider has access to [facility] Link:no  Safety Planning and Suicide Prevention discussed: Yes,  SPE completed with the patient and patient's husband.     Has patient been referred to the Quitline?: Patient does not use tobacco/nicotine products  Patient has been referred for addiction treatment: No known substance use disorder.  Larri Ply, LCSW 08/09/2023, 3:05 PM

## 2023-08-09 NOTE — [facility] Suicide Risk Assessment (Signed)
 [facility] Discharge Suicide Risk Assessment   Principal Problem: MDD (major depressive disorder), severe (HCC) Discharge Diagnoses: Principal Problem:   MDD (major depressive disorder), severe (HCC) Active Problems:   Anxiety   Total Time spent with patient: 1 hour  Musculoskeletal: Strength & Muscle Tone: within normal limits Gait & Station: normal Patient leans: N/A  Psychiatric Specialty Exam  Presentation  General Appearance:  Casual  Eye Contact: Fair  Speech: Clear and Coherent  Speech Volume: Normal  Handedness:No data recorded  Mood and Affect  Mood: Euthymic  Duration of Depression Symptoms: No data recorded Affect: Congruent   Thought Process  Thought Processes: Coherent  Descriptions of Associations:Intact  Orientation:Full (Time, Place and Person)  Thought Content:Logical  History of Schizophrenia/Schizoaffective disorder:No data recorded Duration of Psychotic Symptoms:No data recorded Hallucinations:Hallucinations: None  Ideas of Reference:None  Suicidal Thoughts:Suicidal Thoughts: No  Homicidal Thoughts:Homicidal Thoughts: No   Sensorium  Memory: Immediate Fair; Recent Fair  Judgment: Fair  Insight: Fair   Art therapist  Concentration: Fair  Attention Span:No data recorded Recall:No data recorded Fund of Knowledge:No data recorded Language:No data recorded  Psychomotor Activity  Psychomotor Activity: Psychomotor Activity: Normal   Assets  Assets: Manufacturing systems engineer; Desire for Improvement; Social Support; Vocational/Educational; Housing; Health and safety inspector; Leisure Time; Physical Health   Sleep  Sleep: Sleep: Fair  Estimated Sleeping Duration (Last 24 Hours): 5.75-7.25 hours  Physical Exam: Physical Exam Vitals and nursing note reviewed.  HENT:     Head: Atraumatic.   Eyes:     Extraocular Movements: Extraocular movements intact.   Pulmonary:     Effort: Pulmonary effort is  normal.   Neurological:     Mental Status: She is alert and oriented to person, place, and time.   Psychiatric:        Mood and Affect: Mood normal.        Behavior: Behavior normal.    Review of Systems  Psychiatric/Behavioral:  Negative for depression, hallucinations, memory loss, substance abuse and suicidal ideas. The patient is nervous/anxious. The patient does not have insomnia.    Blood pressure 136/79, pulse 71, temperature 98.2 F (36.8 C), resp. rate 20, height 5' 6 (1.676 m), weight 73.5 kg, SpO2 100%. Body mass index is 26.15 kg/m.  Mental Status Per Nursing Assessment::   On Admission:  Suicidal ideation indicated by others  Demographic Factors:  Caucasian  Loss Factors: NA  Historical Factors: NA  Risk Reduction Factors:   Religious beliefs about death, Employed, Living with another person, especially a relative, Positive social support, Positive therapeutic relationship, and Positive coping skills or problem solving skills  Continued Clinical Symptoms:  Previous Psychiatric Diagnoses and Treatments  Cognitive Features That Contribute To Risk:  None    Suicide Risk:  Minimal: No identifiable suicidal ideation.  Patients presenting with no risk factors but with morbid ruminations; may be classified as minimal risk based on the severity of the depressive symptoms   Follow-up Information     [facility] Follow up.   Why: Appointment is virtual.  After this appointment you will be scheduled for an appointment in the [facility] location.  Appointment is 08/12/2023 at 2:00PM. Please have the paperwork completed and submitted by Sunday evening.  It is a requirement and must be don for the appointment to be held Contact information: [address] [place], [place], [address] Phone: [phone] Fax: [phone]                Plan Of  Care/Follow-up recommendations:  # It is recommended to the patient to continue psychiatric  medications as prescribed, after discharge from the hospital.   # It is recommended to the patient to follow up with your outpatient psychiatric provider and PCP. # It was discussed with the patient, the impact of alcohol, drugs, tobacco have been there overall psychiatric and medical wellbeing, and total abstinence from substance use was recommended. # Prescriptions provided or sent directly to preferred pharmacy at discharge. Patient agreeable to plan. Given the opportunity to ask questions. Appears to feel comfortable with discharge.  # In the event of worsening symptoms, the patient is instructed to call the crisis hotline (988), 911 and or go to the nearest ED for appropriate evaluation and treatment of symptoms. To follow-up with primary care provider for other medical issues, concerns and or health care needs # Patient was discharged home as requested with a plan to follow up as noted above.    Fay Hoop, PA-C 08/09/2023, 3:07 PM

## 2023-08-09 NOTE — Group Note (Signed)
 Date:  08/09/2023 Time:  3:06 PM  Group Topic/Focus:  Crisis Planning:   The purpose of this group is to help patients create a crisis plan for use upon discharge or in the future, as needed.    Participation Level:  Active  Participation Quality:  Appropriate  Affect:  Appropriate  Cognitive:  Appropriate  Insight: Appropriate  Engagement in Group:  Engaged  Modes of Intervention:  Activity  Additional Comments:    Marianna Shirk Jairo Bellew 08/09/2023, 3:06 PM

## 2023-08-12 DIAGNOSIS — Z9151 Personal history of suicidal behavior: Secondary | ICD-10-CM | POA: Diagnosis not present

## 2023-08-12 DIAGNOSIS — F411 Generalized anxiety disorder: Secondary | ICD-10-CM | POA: Diagnosis not present

## 2023-08-13 DIAGNOSIS — N951 Menopausal and female climacteric states: Secondary | ICD-10-CM | POA: Diagnosis not present

## 2023-08-13 DIAGNOSIS — Z1329 Encounter for screening for other suspected endocrine disorder: Secondary | ICD-10-CM | POA: Diagnosis not present

## 2023-08-14 DIAGNOSIS — F4323 Adjustment disorder with mixed anxiety and depressed mood: Secondary | ICD-10-CM | POA: Diagnosis not present

## 2023-08-19 DIAGNOSIS — N951 Menopausal and female climacteric states: Secondary | ICD-10-CM | POA: Diagnosis not present

## 2023-08-19 DIAGNOSIS — R4586 Emotional lability: Secondary | ICD-10-CM | POA: Diagnosis not present

## 2023-08-19 DIAGNOSIS — G479 Sleep disorder, unspecified: Secondary | ICD-10-CM | POA: Diagnosis not present

## 2023-08-19 DIAGNOSIS — R6882 Decreased libido: Secondary | ICD-10-CM | POA: Diagnosis not present

## 2023-08-28 DIAGNOSIS — F4323 Adjustment disorder with mixed anxiety and depressed mood: Secondary | ICD-10-CM | POA: Diagnosis not present

## 2023-09-05 DIAGNOSIS — F411 Generalized anxiety disorder: Secondary | ICD-10-CM | POA: Diagnosis not present

## 2023-09-05 DIAGNOSIS — Z9151 Personal history of suicidal behavior: Secondary | ICD-10-CM | POA: Diagnosis not present

## 2023-09-11 DIAGNOSIS — F4323 Adjustment disorder with mixed anxiety and depressed mood: Secondary | ICD-10-CM | POA: Diagnosis not present

## 2023-09-23 DIAGNOSIS — M47812 Spondylosis without myelopathy or radiculopathy, cervical region: Secondary | ICD-10-CM | POA: Diagnosis not present

## 2023-09-24 DIAGNOSIS — M4712 Other spondylosis with myelopathy, cervical region: Secondary | ICD-10-CM | POA: Diagnosis not present

## 2023-09-24 DIAGNOSIS — Z6829 Body mass index (BMI) 29.0-29.9, adult: Secondary | ICD-10-CM | POA: Diagnosis not present

## 2023-09-24 DIAGNOSIS — M5020 Other cervical disc displacement, unspecified cervical region: Secondary | ICD-10-CM | POA: Diagnosis not present

## 2023-09-25 DIAGNOSIS — F4323 Adjustment disorder with mixed anxiety and depressed mood: Secondary | ICD-10-CM | POA: Diagnosis not present

## 2023-10-05 IMAGING — US US THYROID
1 series · 13 of 25 positions shown · non-contrast
Comparison: 06/28/2016

Biopsy 10/28/2012

CLINICAL DATA: 51-year-old female with thyroid nodule

EXAM:
THYROID ULTRASOUND
TECHNIQUE: Ultrasound examination of the thyroid gland and adjacent soft
tissues was performed.

[Series 1: us thyroid · 0.06mm/px · 13 of 73 slices shown]
[im 1/73]
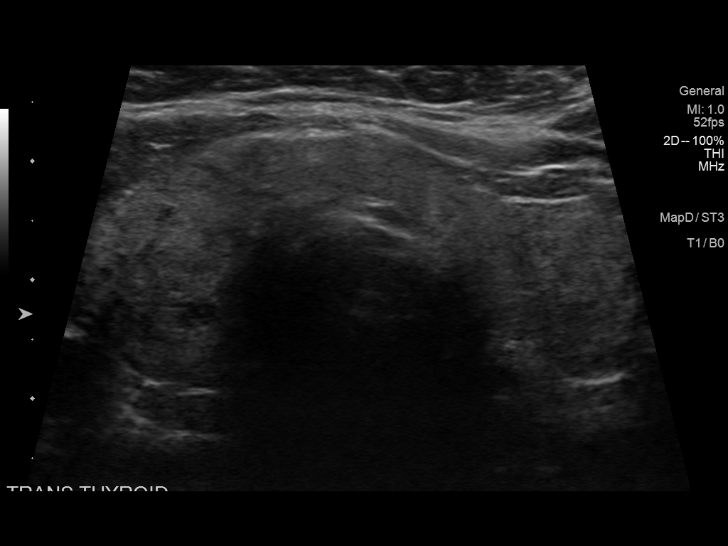
[im 7/73]
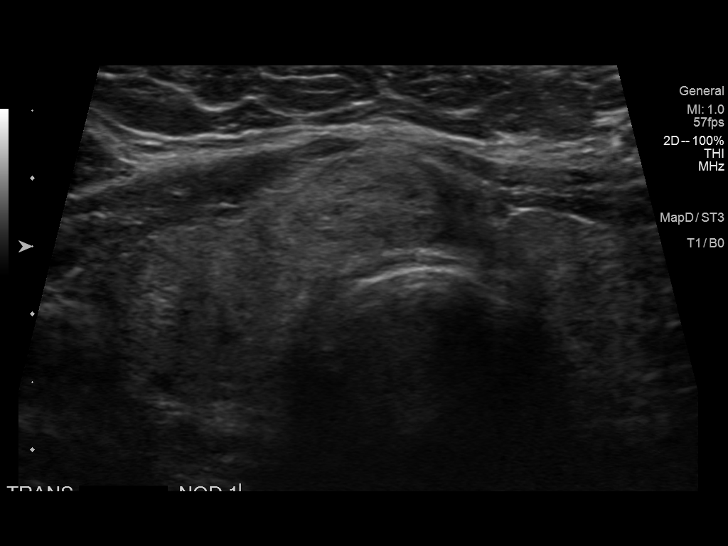
[im 13/73]
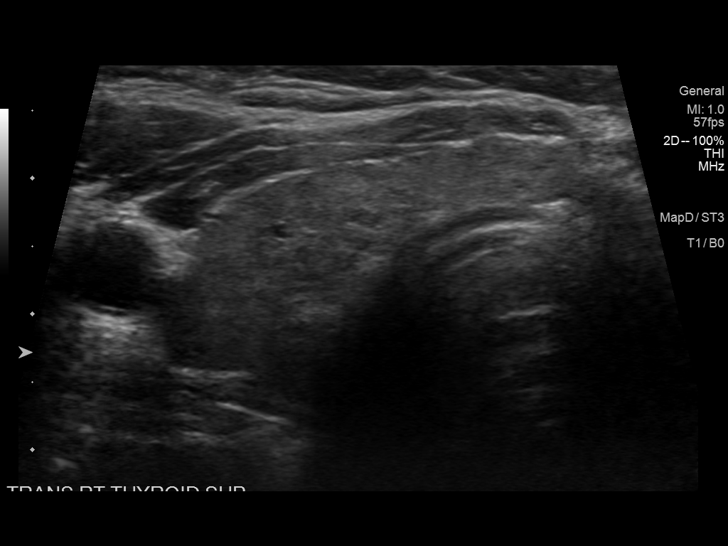
[im 19/73]
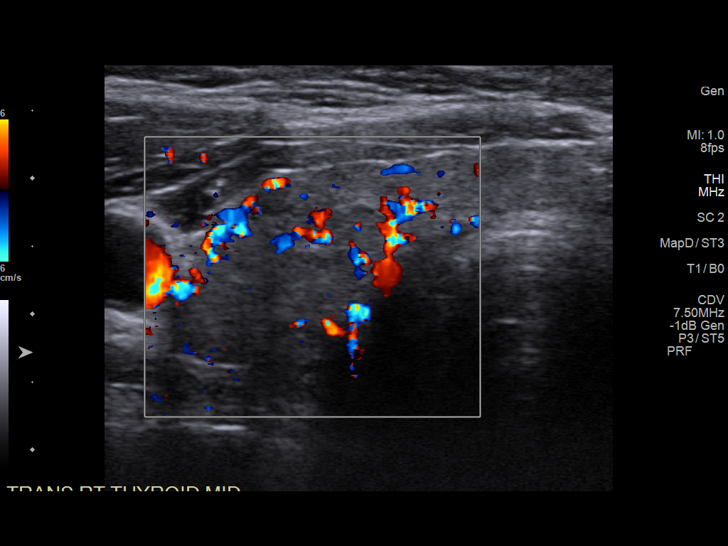
[im 25/73]
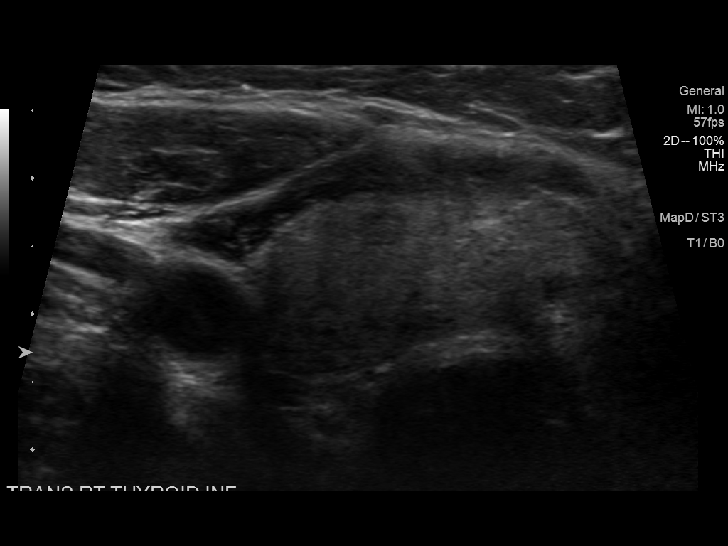
[im 31/73]
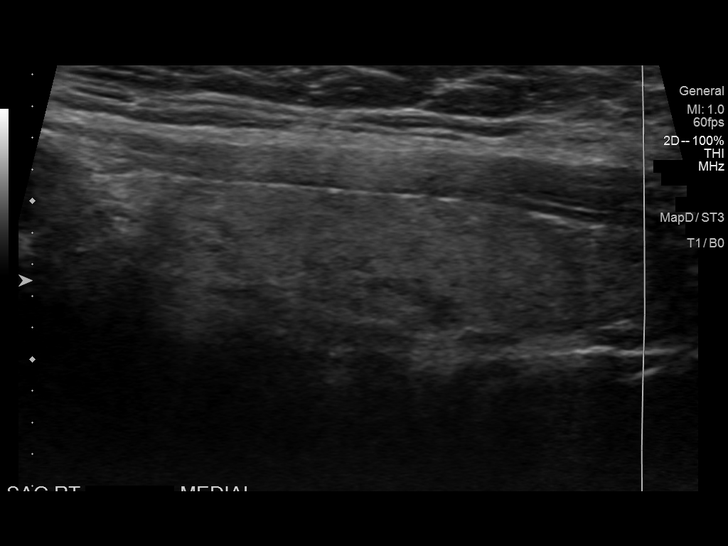
[im 37/73]
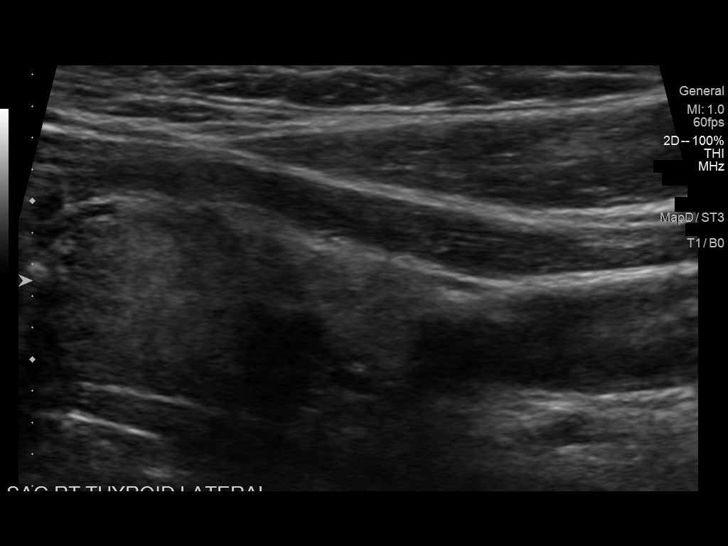
[im 43/73]
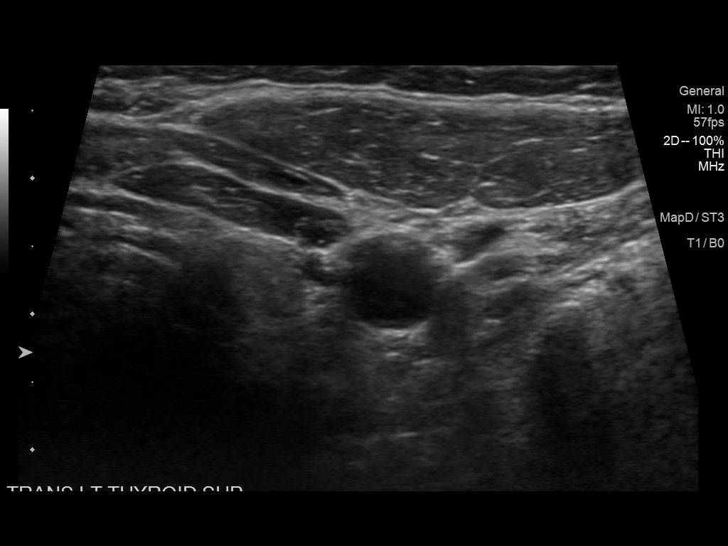
[im 49/73]
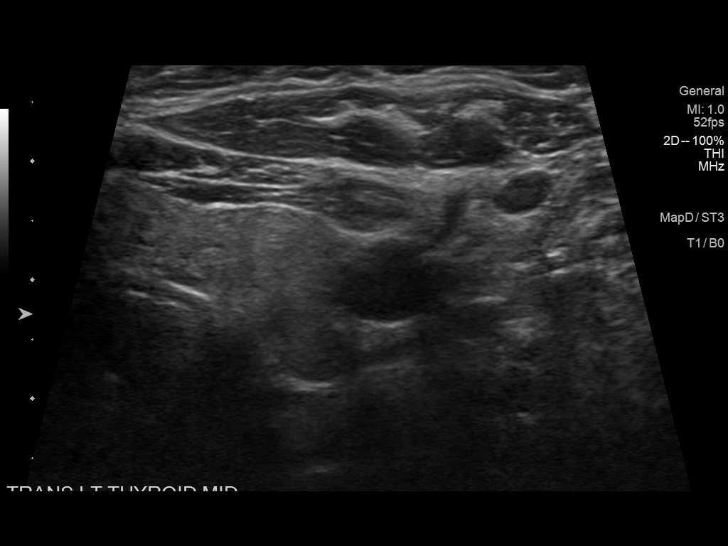
[im 55/73]
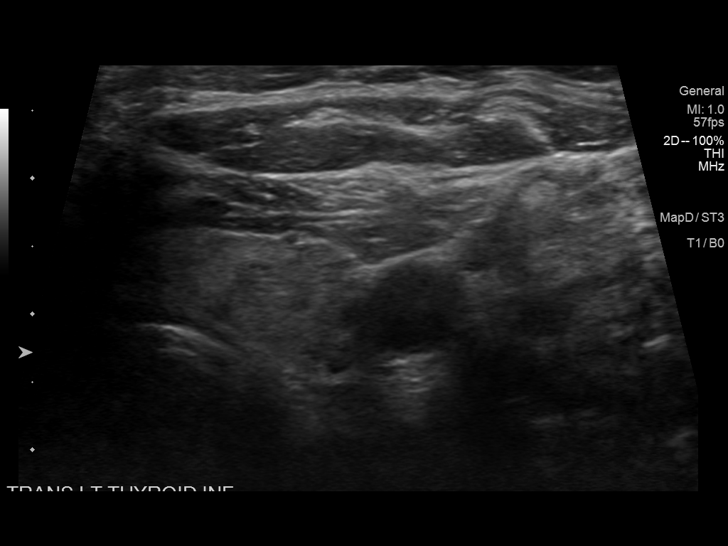
[im 61/73]
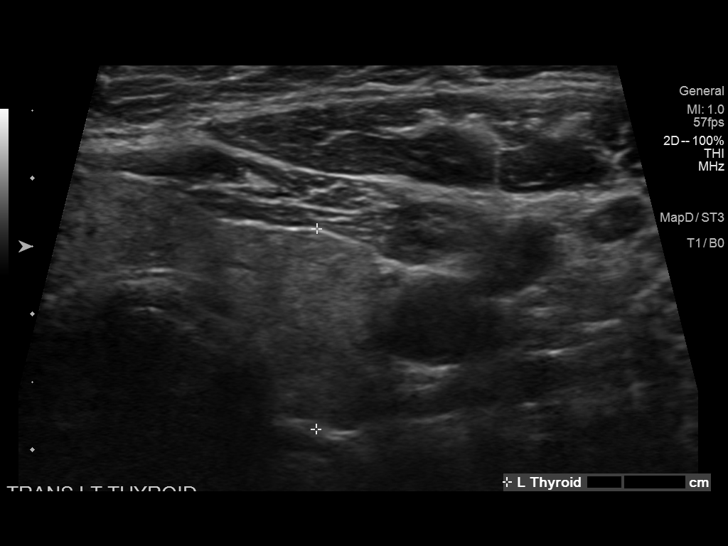
[im 67/73]
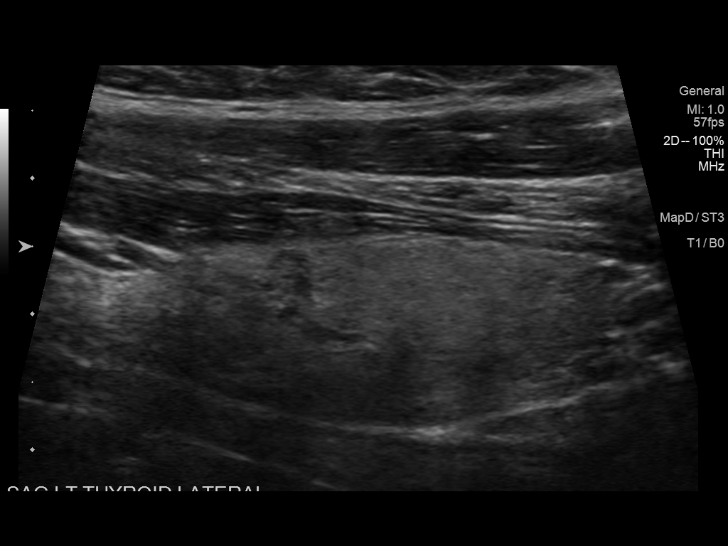
[im 73/73]
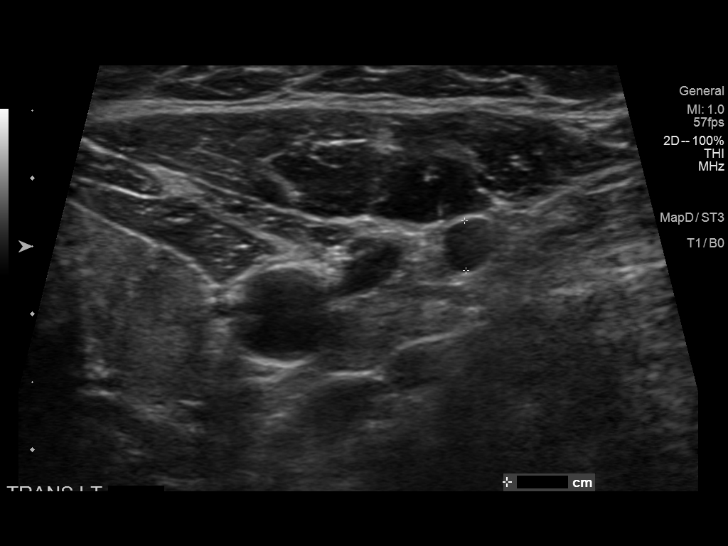

[13 of 25 positions shown; findings below may reference images not displayed]

FINDINGS: Parenchymal Echotexture: Mildly heterogenous

Isthmus: 0.8 cm

Right lobe: 4.1 cm x 1.9 cm x 1.3 cm

Left lobe: 4.3 cm x 1.5 cm x 0.9 cm

_________________________________________________________

Estimated total number of nodules >/= 1 cm: 2

Number of spongiform nodules >/=  2 cm not described below (TR1): 0

Number of mixed cystic and solid nodules >/= 1.5 cm not described
below (TR2): 0

_________________________________________________________

Isthmic nodule, 1.4 cm, relatively unchanged from the prior.
Assuming benign result, no further specific follow-up would be
indicated.

The right lower thyroid nodule has been unchanged for greater than 5
years, 1.6 cm. Discontinuation of surveillance recommended by
updated ACR TIRADS criteria.

No new nodules.

No adenopathy.

Recommendations follow those established by the new ACR TI-RADS
criteria ([HOSPITAL] 6017;[DATE]).
IMPRESSION: Mild heterogeneity of the thyroid, compatible with medical thyroid
disease.

Stable nodules as above.

## 2023-10-08 DIAGNOSIS — M79644 Pain in right finger(s): Secondary | ICD-10-CM | POA: Diagnosis not present

## 2023-10-09 DIAGNOSIS — Z9151 Personal history of suicidal behavior: Secondary | ICD-10-CM | POA: Diagnosis not present

## 2023-10-09 DIAGNOSIS — F411 Generalized anxiety disorder: Secondary | ICD-10-CM | POA: Diagnosis not present

## 2023-10-10 DIAGNOSIS — F4323 Adjustment disorder with mixed anxiety and depressed mood: Secondary | ICD-10-CM | POA: Diagnosis not present

## 2023-10-11 DIAGNOSIS — M47812 Spondylosis without myelopathy or radiculopathy, cervical region: Secondary | ICD-10-CM | POA: Diagnosis not present

## 2023-10-14 DIAGNOSIS — R768 Other specified abnormal immunological findings in serum: Secondary | ICD-10-CM | POA: Diagnosis not present

## 2023-10-14 DIAGNOSIS — N951 Menopausal and female climacteric states: Secondary | ICD-10-CM | POA: Diagnosis not present

## 2023-10-16 DIAGNOSIS — N951 Menopausal and female climacteric states: Secondary | ICD-10-CM | POA: Diagnosis not present

## 2023-10-16 DIAGNOSIS — R6882 Decreased libido: Secondary | ICD-10-CM | POA: Diagnosis not present

## 2023-10-16 DIAGNOSIS — G479 Sleep disorder, unspecified: Secondary | ICD-10-CM | POA: Diagnosis not present

## 2023-10-28 DIAGNOSIS — M47812 Spondylosis without myelopathy or radiculopathy, cervical region: Secondary | ICD-10-CM | POA: Diagnosis not present

## 2023-10-28 DIAGNOSIS — G8929 Other chronic pain: Secondary | ICD-10-CM | POA: Diagnosis not present

## 2023-10-28 DIAGNOSIS — M792 Neuralgia and neuritis, unspecified: Secondary | ICD-10-CM | POA: Diagnosis not present

## 2023-10-30 DIAGNOSIS — Z0100 Encounter for examination of eyes and vision without abnormal findings: Secondary | ICD-10-CM | POA: Diagnosis not present

## 2023-11-05 DIAGNOSIS — M79644 Pain in right finger(s): Secondary | ICD-10-CM | POA: Diagnosis not present

## 2023-11-06 DIAGNOSIS — M79644 Pain in right finger(s): Secondary | ICD-10-CM | POA: Diagnosis not present

## 2023-11-11 DIAGNOSIS — F4323 Adjustment disorder with mixed anxiety and depressed mood: Secondary | ICD-10-CM | POA: Diagnosis not present

## 2023-11-11 DIAGNOSIS — M79644 Pain in right finger(s): Secondary | ICD-10-CM | POA: Diagnosis not present

## 2023-11-12 DIAGNOSIS — E038 Other specified hypothyroidism: Secondary | ICD-10-CM | POA: Diagnosis not present

## 2023-11-12 DIAGNOSIS — M79644 Pain in right finger(s): Secondary | ICD-10-CM | POA: Diagnosis not present

## 2023-11-12 DIAGNOSIS — N951 Menopausal and female climacteric states: Secondary | ICD-10-CM | POA: Diagnosis not present

## 2023-11-13 DIAGNOSIS — F411 Generalized anxiety disorder: Secondary | ICD-10-CM | POA: Diagnosis not present

## 2023-11-13 DIAGNOSIS — F39 Unspecified mood [affective] disorder: Secondary | ICD-10-CM | POA: Diagnosis not present

## 2023-11-13 DIAGNOSIS — Z9151 Personal history of suicidal behavior: Secondary | ICD-10-CM | POA: Diagnosis not present

## 2023-11-14 DIAGNOSIS — E038 Other specified hypothyroidism: Secondary | ICD-10-CM | POA: Diagnosis not present

## 2023-11-14 DIAGNOSIS — N958 Other specified menopausal and perimenopausal disorders: Secondary | ICD-10-CM | POA: Diagnosis not present

## 2023-11-14 DIAGNOSIS — N951 Menopausal and female climacteric states: Secondary | ICD-10-CM | POA: Diagnosis not present

## 2023-11-20 DIAGNOSIS — M79644 Pain in right finger(s): Secondary | ICD-10-CM | POA: Diagnosis not present

## 2023-11-25 DIAGNOSIS — F4323 Adjustment disorder with mixed anxiety and depressed mood: Secondary | ICD-10-CM | POA: Diagnosis not present

## 2023-11-26 DIAGNOSIS — M79644 Pain in right finger(s): Secondary | ICD-10-CM | POA: Diagnosis not present

## 2023-11-26 DIAGNOSIS — M79641 Pain in right hand: Secondary | ICD-10-CM | POA: Diagnosis not present

## 2023-12-02 DIAGNOSIS — M47812 Spondylosis without myelopathy or radiculopathy, cervical region: Secondary | ICD-10-CM | POA: Diagnosis not present

## 2023-12-05 DIAGNOSIS — M79644 Pain in right finger(s): Secondary | ICD-10-CM | POA: Diagnosis not present

## 2023-12-05 DIAGNOSIS — M79641 Pain in right hand: Secondary | ICD-10-CM | POA: Diagnosis not present

## 2023-12-10 DIAGNOSIS — F4323 Adjustment disorder with mixed anxiety and depressed mood: Secondary | ICD-10-CM | POA: Diagnosis not present

## 2023-12-11 DIAGNOSIS — M79644 Pain in right finger(s): Secondary | ICD-10-CM | POA: Diagnosis not present

## 2023-12-11 DIAGNOSIS — M79641 Pain in right hand: Secondary | ICD-10-CM | POA: Diagnosis not present

## 2023-12-13 DIAGNOSIS — M47812 Spondylosis without myelopathy or radiculopathy, cervical region: Secondary | ICD-10-CM | POA: Diagnosis not present

## 2023-12-18 DIAGNOSIS — M79641 Pain in right hand: Secondary | ICD-10-CM | POA: Diagnosis not present

## 2023-12-18 DIAGNOSIS — M79644 Pain in right finger(s): Secondary | ICD-10-CM | POA: Diagnosis not present

## 2023-12-24 DIAGNOSIS — Z6829 Body mass index (BMI) 29.0-29.9, adult: Secondary | ICD-10-CM | POA: Diagnosis not present

## 2023-12-24 DIAGNOSIS — M542 Cervicalgia: Secondary | ICD-10-CM | POA: Diagnosis not present

## 2023-12-24 DIAGNOSIS — M5020 Other cervical disc displacement, unspecified cervical region: Secondary | ICD-10-CM | POA: Diagnosis not present

## 2023-12-25 ENCOUNTER — Other Ambulatory Visit: Payer: Self-pay | Admitting: Neurosurgery

## 2023-12-26 DIAGNOSIS — E038 Other specified hypothyroidism: Secondary | ICD-10-CM | POA: Diagnosis not present

## 2023-12-26 DIAGNOSIS — N951 Menopausal and female climacteric states: Secondary | ICD-10-CM | POA: Diagnosis not present

## 2023-12-30 DIAGNOSIS — F39 Unspecified mood [affective] disorder: Secondary | ICD-10-CM | POA: Diagnosis not present

## 2023-12-30 DIAGNOSIS — F411 Generalized anxiety disorder: Secondary | ICD-10-CM | POA: Diagnosis not present

## 2023-12-30 DIAGNOSIS — Z9151 Personal history of suicidal behavior: Secondary | ICD-10-CM | POA: Diagnosis not present

## 2023-12-31 DIAGNOSIS — G479 Sleep disorder, unspecified: Secondary | ICD-10-CM | POA: Diagnosis not present

## 2023-12-31 DIAGNOSIS — E038 Other specified hypothyroidism: Secondary | ICD-10-CM | POA: Diagnosis not present

## 2023-12-31 DIAGNOSIS — N951 Menopausal and female climacteric states: Secondary | ICD-10-CM | POA: Diagnosis not present

## 2023-12-31 DIAGNOSIS — Z6828 Body mass index (BMI) 28.0-28.9, adult: Secondary | ICD-10-CM | POA: Diagnosis not present

## 2024-01-01 DIAGNOSIS — F4323 Adjustment disorder with mixed anxiety and depressed mood: Secondary | ICD-10-CM | POA: Diagnosis not present

## 2024-01-03 DIAGNOSIS — M79641 Pain in right hand: Secondary | ICD-10-CM | POA: Diagnosis not present

## 2024-01-03 DIAGNOSIS — M79644 Pain in right finger(s): Secondary | ICD-10-CM | POA: Diagnosis not present

## 2024-01-06 ENCOUNTER — Other Ambulatory Visit (HOSPITAL_COMMUNITY)

## 2024-01-13 DIAGNOSIS — S63641A Sprain of metacarpophalangeal joint of right thumb, initial encounter: Secondary | ICD-10-CM | POA: Diagnosis not present

## 2024-01-14 DIAGNOSIS — S63418A Traumatic rupture of collateral ligament of other finger at metacarpophalangeal and interphalangeal joint, initial encounter: Secondary | ICD-10-CM | POA: Diagnosis not present

## 2024-01-14 DIAGNOSIS — S63641A Sprain of metacarpophalangeal joint of right thumb, initial encounter: Secondary | ICD-10-CM | POA: Diagnosis not present

## 2024-01-14 DIAGNOSIS — Y999 Unspecified external cause status: Secondary | ICD-10-CM | POA: Diagnosis not present

## 2024-01-14 DIAGNOSIS — X58XXXA Exposure to other specified factors, initial encounter: Secondary | ICD-10-CM | POA: Diagnosis not present

## 2024-01-14 DIAGNOSIS — G8918 Other acute postprocedural pain: Secondary | ICD-10-CM | POA: Diagnosis not present

## 2024-01-15 ENCOUNTER — Ambulatory Visit (HOSPITAL_COMMUNITY): Admit: 2024-01-15 | Admitting: Neurosurgery

## 2024-01-15 SURGERY — ANTERIOR CERVICAL DECOMPRESSION/DISCECTOMY FUSION 1 LEVEL/HARDWARE REMOVAL
Anesthesia: General

## 2024-01-20 DIAGNOSIS — M79644 Pain in right finger(s): Secondary | ICD-10-CM | POA: Diagnosis not present

## 2024-01-23 DIAGNOSIS — F4323 Adjustment disorder with mixed anxiety and depressed mood: Secondary | ICD-10-CM | POA: Diagnosis not present

## 2024-01-23 DIAGNOSIS — M79641 Pain in right hand: Secondary | ICD-10-CM | POA: Diagnosis not present

## 2024-01-23 DIAGNOSIS — M79644 Pain in right finger(s): Secondary | ICD-10-CM | POA: Diagnosis not present

## 2024-02-05 DIAGNOSIS — F419 Anxiety disorder, unspecified: Secondary | ICD-10-CM | POA: Diagnosis not present

## 2024-02-05 DIAGNOSIS — Z0000 Encounter for general adult medical examination without abnormal findings: Secondary | ICD-10-CM | POA: Diagnosis not present

## 2024-02-05 DIAGNOSIS — Z6828 Body mass index (BMI) 28.0-28.9, adult: Secondary | ICD-10-CM | POA: Diagnosis not present

## 2024-02-05 DIAGNOSIS — L719 Rosacea, unspecified: Secondary | ICD-10-CM | POA: Diagnosis not present

## 2024-02-05 DIAGNOSIS — Z23 Encounter for immunization: Secondary | ICD-10-CM | POA: Diagnosis not present

## 2024-02-05 DIAGNOSIS — M79641 Pain in right hand: Secondary | ICD-10-CM | POA: Diagnosis not present

## 2024-02-05 DIAGNOSIS — R7303 Prediabetes: Secondary | ICD-10-CM | POA: Diagnosis not present

## 2024-02-05 DIAGNOSIS — I10 Essential (primary) hypertension: Secondary | ICD-10-CM | POA: Diagnosis not present

## 2024-02-05 DIAGNOSIS — E663 Overweight: Secondary | ICD-10-CM | POA: Diagnosis not present

## 2024-02-05 DIAGNOSIS — M79644 Pain in right finger(s): Secondary | ICD-10-CM | POA: Diagnosis not present
